# Patient Record
Sex: Female | Born: 1960 | Race: White | Hispanic: No | Marital: Married
Health system: Southern US, Academic
[De-identification: ages and names within clinical notes are randomized; demographics above are authoritative.]

## PROBLEM LIST (undated history)

## (undated) ENCOUNTER — Encounter

## (undated) ENCOUNTER — Telehealth
Attending: Student in an Organized Health Care Education/Training Program | Primary: Student in an Organized Health Care Education/Training Program

## (undated) ENCOUNTER — Encounter
Attending: Student in an Organized Health Care Education/Training Program | Primary: Student in an Organized Health Care Education/Training Program

## (undated) ENCOUNTER — Ambulatory Visit: Payer: PRIVATE HEALTH INSURANCE

## (undated) ENCOUNTER — Telehealth: Attending: Internal Medicine | Primary: Internal Medicine

## (undated) ENCOUNTER — Telehealth

## (undated) ENCOUNTER — Ambulatory Visit

## (undated) ENCOUNTER — Ambulatory Visit
Payer: PRIVATE HEALTH INSURANCE | Attending: Rehabilitative and Restorative Service Providers" | Primary: Rehabilitative and Restorative Service Providers"

## (undated) ENCOUNTER — Encounter: Attending: Diagnostic Radiology | Primary: Diagnostic Radiology

## (undated) ENCOUNTER — Encounter: Attending: Family | Primary: Family

## (undated) ENCOUNTER — Encounter
Attending: Rehabilitative and Restorative Service Providers" | Primary: Rehabilitative and Restorative Service Providers"

## (undated) ENCOUNTER — Ambulatory Visit
Payer: Medicare (Managed Care) | Attending: Student in an Organized Health Care Education/Training Program | Primary: Student in an Organized Health Care Education/Training Program

## (undated) ENCOUNTER — Ambulatory Visit: Attending: Internal Medicine | Primary: Internal Medicine

## (undated) ENCOUNTER — Telehealth: Attending: Family | Primary: Family

## (undated) ENCOUNTER — Encounter: Attending: Hematology & Oncology | Primary: Hematology & Oncology

## (undated) ENCOUNTER — Ambulatory Visit: Attending: Family | Primary: Family

## (undated) ENCOUNTER — Ambulatory Visit: Payer: Medicare (Managed Care)

## (undated) ENCOUNTER — Encounter: Attending: Internal Medicine | Primary: Internal Medicine

## (undated) ENCOUNTER — Ambulatory Visit
Attending: Student in an Organized Health Care Education/Training Program | Primary: Student in an Organized Health Care Education/Training Program

## (undated) ENCOUNTER — Ambulatory Visit: Attending: Family Medicine | Primary: Family Medicine

## (undated) ENCOUNTER — Ambulatory Visit
Payer: PRIVATE HEALTH INSURANCE | Attending: Student in an Organized Health Care Education/Training Program | Primary: Student in an Organized Health Care Education/Training Program

## (undated) ENCOUNTER — Ambulatory Visit: Attending: Pharmacist | Primary: Pharmacist

## (undated) ENCOUNTER — Ambulatory Visit: Payer: PRIVATE HEALTH INSURANCE | Attending: Hematology & Oncology | Primary: Hematology & Oncology

## (undated) ENCOUNTER — Encounter: Attending: Family Medicine | Primary: Family Medicine

## (undated) ENCOUNTER — Ambulatory Visit: Payer: PRIVATE HEALTH INSURANCE | Attending: Internal Medicine | Primary: Internal Medicine

## (undated) ENCOUNTER — Ambulatory Visit: Payer: BLUE CROSS/BLUE SHIELD

## (undated) ENCOUNTER — Encounter: Attending: "Endocrinology | Primary: "Endocrinology

## (undated) DIAGNOSIS — G459 Transient cerebral ischemic attack, unspecified: Secondary | ICD-10-CM

## (undated) DIAGNOSIS — K589 Irritable bowel syndrome without diarrhea: Secondary | ICD-10-CM

## (undated) DIAGNOSIS — K219 Gastro-esophageal reflux disease without esophagitis: Secondary | ICD-10-CM

## (undated) DIAGNOSIS — L4050 Arthropathic psoriasis, unspecified: Secondary | ICD-10-CM

## (undated) DIAGNOSIS — F419 Anxiety disorder, unspecified: Secondary | ICD-10-CM

## (undated) DIAGNOSIS — R079 Chest pain, unspecified: Secondary | ICD-10-CM

## (undated) DIAGNOSIS — E785 Hyperlipidemia, unspecified: Secondary | ICD-10-CM

## (undated) DIAGNOSIS — I5189 Other ill-defined heart diseases: Secondary | ICD-10-CM

## (undated) DIAGNOSIS — M1990 Unspecified osteoarthritis, unspecified site: Secondary | ICD-10-CM

## (undated) DIAGNOSIS — Z8719 Personal history of other diseases of the digestive system: Secondary | ICD-10-CM

## (undated) HISTORY — PX: BACK SURGERY: SHX140

## (undated) HISTORY — PX: ENDARTERECTOMY: SHX5162

## (undated) HISTORY — DX: Chest pain, unspecified: R07.9

## (undated) HISTORY — PX: TUBAL LIGATION: SHX77

## (undated) HISTORY — DX: Hyperlipidemia, unspecified: E78.5

## (undated) HISTORY — DX: Gastro-esophageal reflux disease without esophagitis: K21.9

## (undated) HISTORY — PX: EYE SURGERY: SHX253

## (undated) HISTORY — DX: Other ill-defined heart diseases: I51.89

## (undated) HISTORY — PX: CHOLECYSTECTOMY: SHX55

## (undated) HISTORY — DX: Irritable bowel syndrome, unspecified: K58.9

---

## 1993-12-18 DIAGNOSIS — G459 Transient cerebral ischemic attack, unspecified: Secondary | ICD-10-CM

## 1993-12-18 HISTORY — DX: Transient cerebral ischemic attack, unspecified: G45.9

## 1998-03-22 ENCOUNTER — Encounter: Admission: RE | Admit: 1998-03-22 | Discharge: 1998-03-22 | Payer: Self-pay | Admitting: Sports Medicine

## 1998-04-16 ENCOUNTER — Encounter: Admission: RE | Admit: 1998-04-16 | Discharge: 1998-04-16 | Payer: Self-pay | Admitting: Family Medicine

## 1998-04-19 ENCOUNTER — Ambulatory Visit (HOSPITAL_COMMUNITY): Admission: RE | Admit: 1998-04-19 | Discharge: 1998-04-19 | Payer: Self-pay | Admitting: Family Medicine

## 1998-05-14 ENCOUNTER — Ambulatory Visit: Admission: RE | Admit: 1998-05-14 | Discharge: 1998-05-14 | Payer: Self-pay | Admitting: Family Medicine

## 1998-06-18 ENCOUNTER — Encounter: Admission: RE | Admit: 1998-06-18 | Discharge: 1998-06-18 | Payer: Self-pay | Admitting: Family Medicine

## 1998-09-06 ENCOUNTER — Encounter: Admission: RE | Admit: 1998-09-06 | Discharge: 1998-09-06 | Payer: Self-pay | Admitting: Family Medicine

## 1998-09-13 ENCOUNTER — Other Ambulatory Visit: Admission: RE | Admit: 1998-09-13 | Discharge: 1998-09-13 | Payer: Self-pay | Admitting: Family Medicine

## 1998-09-13 ENCOUNTER — Encounter: Admission: RE | Admit: 1998-09-13 | Discharge: 1998-09-13 | Payer: Self-pay | Admitting: Family Medicine

## 1998-10-25 ENCOUNTER — Encounter: Admission: RE | Admit: 1998-10-25 | Discharge: 1998-10-25 | Payer: Self-pay | Admitting: Family Medicine

## 1998-10-26 ENCOUNTER — Ambulatory Visit (HOSPITAL_COMMUNITY): Admission: RE | Admit: 1998-10-26 | Discharge: 1998-10-26 | Payer: Self-pay | Admitting: Family Medicine

## 1998-10-26 ENCOUNTER — Encounter: Payer: Self-pay | Admitting: Family Medicine

## 1999-01-06 ENCOUNTER — Encounter: Admission: RE | Admit: 1999-01-06 | Discharge: 1999-01-06 | Payer: Self-pay | Admitting: Sports Medicine

## 1999-02-21 ENCOUNTER — Encounter: Admission: RE | Admit: 1999-02-21 | Discharge: 1999-02-21 | Payer: Self-pay | Admitting: Family Medicine

## 1999-04-15 ENCOUNTER — Encounter: Admission: RE | Admit: 1999-04-15 | Discharge: 1999-04-15 | Payer: Self-pay | Admitting: Family Medicine

## 1999-06-03 ENCOUNTER — Encounter: Admission: RE | Admit: 1999-06-03 | Discharge: 1999-06-03 | Payer: Self-pay | Admitting: Family Medicine

## 1999-07-27 ENCOUNTER — Encounter: Admission: RE | Admit: 1999-07-27 | Discharge: 1999-07-27 | Payer: Self-pay | Admitting: Family Medicine

## 1999-08-15 ENCOUNTER — Encounter: Admission: RE | Admit: 1999-08-15 | Discharge: 1999-08-15 | Payer: Self-pay | Admitting: Family Medicine

## 1999-09-21 ENCOUNTER — Encounter: Admission: RE | Admit: 1999-09-21 | Discharge: 1999-09-21 | Payer: Self-pay | Admitting: Family Medicine

## 1999-10-11 ENCOUNTER — Ambulatory Visit (HOSPITAL_COMMUNITY): Admission: RE | Admit: 1999-10-11 | Discharge: 1999-10-11 | Payer: Self-pay | Admitting: Family Medicine

## 1999-10-11 ENCOUNTER — Encounter: Payer: Self-pay | Admitting: Family Medicine

## 2000-02-17 ENCOUNTER — Encounter: Admission: RE | Admit: 2000-02-17 | Discharge: 2000-02-17 | Payer: Self-pay | Admitting: Family Medicine

## 2000-04-27 ENCOUNTER — Encounter: Admission: RE | Admit: 2000-04-27 | Discharge: 2000-04-27 | Payer: Self-pay | Admitting: Family Medicine

## 2000-05-07 ENCOUNTER — Encounter: Payer: Self-pay | Admitting: Emergency Medicine

## 2000-05-07 ENCOUNTER — Emergency Department (HOSPITAL_COMMUNITY): Admission: EM | Admit: 2000-05-07 | Discharge: 2000-05-07 | Payer: Self-pay | Admitting: *Deleted

## 2000-05-14 ENCOUNTER — Encounter: Admission: RE | Admit: 2000-05-14 | Discharge: 2000-05-14 | Payer: Self-pay | Admitting: Family Medicine

## 2000-10-02 ENCOUNTER — Encounter: Payer: Self-pay | Admitting: *Deleted

## 2000-10-02 ENCOUNTER — Emergency Department (HOSPITAL_COMMUNITY): Admission: EM | Admit: 2000-10-02 | Discharge: 2000-10-02 | Payer: Self-pay | Admitting: *Deleted

## 2000-10-04 ENCOUNTER — Encounter: Admission: RE | Admit: 2000-10-04 | Discharge: 2000-10-04 | Payer: Self-pay | Admitting: Family Medicine

## 2000-10-12 ENCOUNTER — Encounter: Payer: Self-pay | Admitting: Family Medicine

## 2000-10-12 ENCOUNTER — Encounter: Admission: RE | Admit: 2000-10-12 | Discharge: 2000-10-12 | Payer: Self-pay | Admitting: Family Medicine

## 2000-10-18 ENCOUNTER — Encounter: Admission: RE | Admit: 2000-10-18 | Discharge: 2000-10-18 | Payer: Self-pay | Admitting: Family Medicine

## 2000-10-31 ENCOUNTER — Encounter: Admission: RE | Admit: 2000-10-31 | Discharge: 2000-10-31 | Payer: Self-pay | Admitting: Family Medicine

## 2000-11-26 ENCOUNTER — Encounter: Admission: RE | Admit: 2000-11-26 | Discharge: 2000-11-26 | Payer: Self-pay | Admitting: Family Medicine

## 2000-11-28 ENCOUNTER — Ambulatory Visit (HOSPITAL_COMMUNITY): Admission: RE | Admit: 2000-11-28 | Discharge: 2000-11-28 | Payer: Self-pay | Admitting: Family Medicine

## 2000-12-05 ENCOUNTER — Ambulatory Visit (HOSPITAL_COMMUNITY): Admission: RE | Admit: 2000-12-05 | Discharge: 2000-12-05 | Payer: Self-pay | Admitting: Family Medicine

## 2000-12-05 ENCOUNTER — Encounter: Payer: Self-pay | Admitting: Family Medicine

## 2000-12-14 ENCOUNTER — Encounter: Admission: RE | Admit: 2000-12-14 | Discharge: 2000-12-14 | Payer: Self-pay | Admitting: Family Medicine

## 2000-12-25 ENCOUNTER — Encounter: Admission: RE | Admit: 2000-12-25 | Discharge: 2000-12-25 | Payer: Self-pay | Admitting: Family Medicine

## 2000-12-31 ENCOUNTER — Encounter: Payer: Self-pay | Admitting: Neurology

## 2000-12-31 ENCOUNTER — Ambulatory Visit (HOSPITAL_COMMUNITY): Admission: AD | Admit: 2000-12-31 | Discharge: 2000-12-31 | Payer: Self-pay | Admitting: Neurology

## 2001-02-01 ENCOUNTER — Encounter: Admission: RE | Admit: 2001-02-01 | Discharge: 2001-02-01 | Payer: Self-pay | Admitting: Family Medicine

## 2001-03-18 ENCOUNTER — Encounter (INDEPENDENT_AMBULATORY_CARE_PROVIDER_SITE_OTHER): Payer: Self-pay | Admitting: *Deleted

## 2001-03-18 LAB — CONVERTED CEMR LAB

## 2001-04-12 ENCOUNTER — Encounter: Admission: RE | Admit: 2001-04-12 | Discharge: 2001-04-12 | Payer: Self-pay | Admitting: Family Medicine

## 2001-04-17 ENCOUNTER — Encounter: Admission: RE | Admit: 2001-04-17 | Discharge: 2001-04-17 | Payer: Self-pay | Admitting: Family Medicine

## 2001-10-15 ENCOUNTER — Encounter: Payer: Self-pay | Admitting: Family Medicine

## 2001-10-15 ENCOUNTER — Encounter: Admission: RE | Admit: 2001-10-15 | Discharge: 2001-10-15 | Payer: Self-pay | Admitting: Family Medicine

## 2001-11-11 ENCOUNTER — Encounter: Admission: RE | Admit: 2001-11-11 | Discharge: 2001-11-11 | Payer: Self-pay | Admitting: Family Medicine

## 2001-11-15 ENCOUNTER — Encounter: Admission: RE | Admit: 2001-11-15 | Discharge: 2001-11-15 | Payer: Self-pay | Admitting: Family Medicine

## 2001-11-15 ENCOUNTER — Encounter: Payer: Self-pay | Admitting: Family Medicine

## 2002-01-24 ENCOUNTER — Other Ambulatory Visit: Admission: RE | Admit: 2002-01-24 | Discharge: 2002-01-24 | Payer: Self-pay | Admitting: *Deleted

## 2002-03-31 ENCOUNTER — Encounter: Admission: RE | Admit: 2002-03-31 | Discharge: 2002-03-31 | Payer: Self-pay | Admitting: Family Medicine

## 2002-11-17 ENCOUNTER — Encounter: Admission: RE | Admit: 2002-11-17 | Discharge: 2002-11-17 | Payer: Self-pay | Admitting: Family Medicine

## 2002-11-17 ENCOUNTER — Encounter: Payer: Self-pay | Admitting: Family Medicine

## 2003-07-27 ENCOUNTER — Emergency Department (HOSPITAL_COMMUNITY): Admission: EM | Admit: 2003-07-27 | Discharge: 2003-07-27 | Payer: Self-pay | Admitting: Emergency Medicine

## 2003-09-21 ENCOUNTER — Other Ambulatory Visit: Admission: RE | Admit: 2003-09-21 | Discharge: 2003-09-21 | Payer: Self-pay | Admitting: Obstetrics and Gynecology

## 2003-12-14 ENCOUNTER — Encounter: Admission: RE | Admit: 2003-12-14 | Discharge: 2003-12-14 | Payer: Self-pay | Admitting: Obstetrics and Gynecology

## 2004-01-01 ENCOUNTER — Encounter: Admission: RE | Admit: 2004-01-01 | Discharge: 2004-01-01 | Payer: Self-pay | Admitting: Family Medicine

## 2004-01-29 ENCOUNTER — Encounter: Admission: RE | Admit: 2004-01-29 | Discharge: 2004-01-29 | Payer: Self-pay | Admitting: Family Medicine

## 2004-02-11 ENCOUNTER — Encounter: Admission: RE | Admit: 2004-02-11 | Discharge: 2004-02-11 | Payer: Self-pay | Admitting: Family Medicine

## 2004-02-15 ENCOUNTER — Encounter: Admission: RE | Admit: 2004-02-15 | Discharge: 2004-02-15 | Payer: Self-pay | Admitting: Family Medicine

## 2004-06-15 ENCOUNTER — Encounter: Payer: Self-pay | Admitting: Gastroenterology

## 2004-12-26 ENCOUNTER — Ambulatory Visit (HOSPITAL_COMMUNITY): Admission: RE | Admit: 2004-12-26 | Discharge: 2004-12-26 | Payer: Self-pay | Admitting: Family Medicine

## 2004-12-26 ENCOUNTER — Ambulatory Visit: Payer: Self-pay | Admitting: Family Medicine

## 2004-12-28 ENCOUNTER — Ambulatory Visit: Payer: Self-pay | Admitting: Gastroenterology

## 2005-01-04 ENCOUNTER — Ambulatory Visit: Payer: Self-pay | Admitting: Gastroenterology

## 2005-01-04 HISTORY — PX: ESOPHAGOGASTRODUODENOSCOPY: SHX1529

## 2005-01-05 ENCOUNTER — Ambulatory Visit: Payer: Self-pay | Admitting: Family Medicine

## 2005-01-27 ENCOUNTER — Ambulatory Visit: Payer: Self-pay | Admitting: Family Medicine

## 2005-02-03 ENCOUNTER — Ambulatory Visit: Payer: Self-pay | Admitting: Gastroenterology

## 2005-02-28 ENCOUNTER — Encounter: Admission: RE | Admit: 2005-02-28 | Discharge: 2005-02-28 | Payer: Self-pay | Admitting: Family Medicine

## 2005-03-16 ENCOUNTER — Encounter: Admission: RE | Admit: 2005-03-16 | Discharge: 2005-03-16 | Payer: Self-pay | Admitting: Family Medicine

## 2005-08-07 ENCOUNTER — Ambulatory Visit: Payer: Self-pay | Admitting: Gastroenterology

## 2005-08-10 ENCOUNTER — Ambulatory Visit (HOSPITAL_COMMUNITY): Admission: RE | Admit: 2005-08-10 | Discharge: 2005-08-10 | Payer: Self-pay | Admitting: Gastroenterology

## 2005-08-15 ENCOUNTER — Encounter: Admission: RE | Admit: 2005-08-15 | Discharge: 2005-11-13 | Payer: Self-pay | Admitting: Gastroenterology

## 2005-08-24 ENCOUNTER — Encounter: Admission: RE | Admit: 2005-08-24 | Discharge: 2005-08-24 | Payer: Self-pay | Admitting: Family Medicine

## 2005-10-11 ENCOUNTER — Ambulatory Visit: Payer: Self-pay | Admitting: Gastroenterology

## 2006-03-01 ENCOUNTER — Encounter: Admission: RE | Admit: 2006-03-01 | Discharge: 2006-03-01 | Payer: Self-pay | Admitting: Family Medicine

## 2006-04-09 ENCOUNTER — Ambulatory Visit: Payer: Self-pay | Admitting: Family Medicine

## 2006-04-16 ENCOUNTER — Ambulatory Visit: Payer: Self-pay | Admitting: Family Medicine

## 2007-02-14 DIAGNOSIS — K589 Irritable bowel syndrome without diarrhea: Secondary | ICD-10-CM | POA: Insufficient documentation

## 2007-02-14 DIAGNOSIS — M545 Low back pain, unspecified: Secondary | ICD-10-CM | POA: Insufficient documentation

## 2007-02-14 DIAGNOSIS — L408 Other psoriasis: Secondary | ICD-10-CM | POA: Insufficient documentation

## 2007-02-14 DIAGNOSIS — K2100 Gastro-esophageal reflux disease with esophagitis, without bleeding: Secondary | ICD-10-CM | POA: Insufficient documentation

## 2007-02-15 ENCOUNTER — Encounter (INDEPENDENT_AMBULATORY_CARE_PROVIDER_SITE_OTHER): Payer: Self-pay | Admitting: *Deleted

## 2007-03-12 ENCOUNTER — Encounter: Admission: RE | Admit: 2007-03-12 | Discharge: 2007-03-12 | Payer: Self-pay | Admitting: Obstetrics and Gynecology

## 2007-08-22 ENCOUNTER — Telehealth: Payer: Self-pay | Admitting: Family Medicine

## 2007-09-04 ENCOUNTER — Ambulatory Visit: Payer: Self-pay | Admitting: Cardiology

## 2008-01-27 ENCOUNTER — Ambulatory Visit: Payer: Self-pay | Admitting: Gastroenterology

## 2008-02-11 ENCOUNTER — Telehealth: Payer: Self-pay | Admitting: Family Medicine

## 2008-03-13 ENCOUNTER — Encounter: Admission: RE | Admit: 2008-03-13 | Discharge: 2008-03-13 | Payer: Self-pay | Admitting: Obstetrics and Gynecology

## 2008-03-17 ENCOUNTER — Emergency Department (HOSPITAL_COMMUNITY): Admission: EM | Admit: 2008-03-17 | Discharge: 2008-03-17 | Payer: Self-pay | Admitting: Emergency Medicine

## 2008-04-17 ENCOUNTER — Emergency Department (HOSPITAL_COMMUNITY): Admission: EM | Admit: 2008-04-17 | Discharge: 2008-04-17 | Payer: Self-pay | Admitting: Emergency Medicine

## 2008-04-18 ENCOUNTER — Emergency Department (HOSPITAL_COMMUNITY): Admission: EM | Admit: 2008-04-18 | Discharge: 2008-04-18 | Payer: Self-pay | Admitting: Emergency Medicine

## 2008-04-20 ENCOUNTER — Ambulatory Visit: Payer: Self-pay | Admitting: Family Medicine

## 2008-04-27 ENCOUNTER — Telehealth: Payer: Self-pay | Admitting: Family Medicine

## 2008-05-15 ENCOUNTER — Ambulatory Visit: Payer: Self-pay | Admitting: Family Medicine

## 2008-05-19 ENCOUNTER — Encounter: Payer: Self-pay | Admitting: Family Medicine

## 2008-05-19 ENCOUNTER — Ambulatory Visit: Payer: Self-pay | Admitting: Family Medicine

## 2008-05-20 LAB — CONVERTED CEMR LAB
HDL: 49 mg/dL (ref >39)
Total CHOL/HDL Ratio: 4.8
Triglycerides: 292 mg/dL — ABNORMAL HIGH (ref <150)
VLDL: 58 mg/dL — ABNORMAL HIGH (ref 0–40)

## 2008-05-22 ENCOUNTER — Encounter: Payer: Self-pay | Admitting: *Deleted

## 2008-10-19 ENCOUNTER — Encounter: Payer: Self-pay | Admitting: Family Medicine

## 2008-10-19 LAB — CONVERTED CEMR LAB: HDL: 50 mg/dL

## 2009-03-04 ENCOUNTER — Telehealth: Payer: Self-pay | Admitting: *Deleted

## 2009-03-10 ENCOUNTER — Ambulatory Visit: Payer: Self-pay | Admitting: Family Medicine

## 2009-03-12 ENCOUNTER — Ambulatory Visit: Payer: Self-pay | Admitting: Family Medicine

## 2009-03-12 ENCOUNTER — Encounter: Payer: Self-pay | Admitting: Family Medicine

## 2009-03-12 LAB — CONVERTED CEMR LAB
ALT: 21 units/L (ref 0–35)
AST: 18 units/L (ref 0–37)
Albumin: 4.5 g/dL (ref 3.5–5.2)
Alkaline Phosphatase: 104 units/L (ref 39–117)
BUN: 13 mg/dL (ref 6–23)
Calcium: 10.2 mg/dL (ref 8.4–10.5)
Chloride: 105 meq/L (ref 96–112)
Cholesterol: 235 mg/dL — ABNORMAL HIGH (ref 0–200)
Creatinine, Ser: 0.82 mg/dL (ref 0.40–1.20)
Glucose, Bld: 96 mg/dL (ref 70–99)
HDL: 42 mg/dL (ref >39)
Potassium: 4.7 meq/L (ref 3.5–5.3)
RDW: 12.4 % (ref 11.5–15.5)
TSH: 1.405 microintl units/mL (ref 0.350–4.500)
Total Bilirubin: 0.5 mg/dL (ref 0.3–1.2)
Total Protein: 7.5 g/dL (ref 6.0–8.3)

## 2009-03-17 ENCOUNTER — Encounter: Admission: RE | Admit: 2009-03-17 | Discharge: 2009-03-17 | Payer: Self-pay | Admitting: Obstetrics and Gynecology

## 2009-03-17 ENCOUNTER — Encounter: Payer: Self-pay | Admitting: Family Medicine

## 2009-03-24 ENCOUNTER — Encounter: Payer: Self-pay | Admitting: Family Medicine

## 2009-03-27 DIAGNOSIS — E785 Hyperlipidemia, unspecified: Secondary | ICD-10-CM | POA: Insufficient documentation

## 2009-04-05 ENCOUNTER — Ambulatory Visit: Payer: Self-pay | Admitting: Family Medicine

## 2009-04-27 ENCOUNTER — Ambulatory Visit: Payer: Self-pay | Admitting: Family Medicine

## 2009-04-29 ENCOUNTER — Encounter: Payer: Self-pay | Admitting: Family Medicine

## 2009-06-04 ENCOUNTER — Telehealth (INDEPENDENT_AMBULATORY_CARE_PROVIDER_SITE_OTHER): Payer: Self-pay | Admitting: *Deleted

## 2009-06-10 ENCOUNTER — Ambulatory Visit: Payer: Self-pay | Admitting: Family Medicine

## 2009-06-10 ENCOUNTER — Encounter: Payer: Self-pay | Admitting: Family Medicine

## 2009-06-10 LAB — CONVERTED CEMR LAB
ALT: 24 units/L (ref 0–35)
AST: 28 units/L (ref 0–37)
BUN: 22 mg/dL (ref 6–23)
Calcium: 9.8 mg/dL (ref 8.4–10.5)
Creatinine, Ser: 0.9 mg/dL (ref 0.40–1.20)
Glucose, Bld: 92 mg/dL (ref 70–99)
Total Bilirubin: 0.4 mg/dL (ref 0.3–1.2)
Total CHOL/HDL Ratio: 4.8
Total Protein: 7.2 g/dL (ref 6.0–8.3)

## 2009-07-09 ENCOUNTER — Ambulatory Visit: Payer: Self-pay | Admitting: Family Medicine

## 2009-08-20 ENCOUNTER — Ambulatory Visit: Payer: Self-pay | Admitting: Family Medicine

## 2009-08-20 LAB — CONVERTED CEMR LAB
ALT: 23 units/L (ref 0–35)
Albumin: 4.4 g/dL (ref 3.5–5.2)
Alkaline Phosphatase: 69 units/L (ref 39–117)
Calcium: 10.1 mg/dL (ref 8.4–10.5)
Chloride: 108 meq/L (ref 96–112)
Creatinine, Ser: 0.89 mg/dL (ref 0.40–1.20)
Glucose, Bld: 93 mg/dL (ref 70–99)
Lipase: 16 units/L (ref 0–75)
Potassium: 4.5 meq/L (ref 3.5–5.3)

## 2009-09-10 ENCOUNTER — Ambulatory Visit: Payer: Self-pay | Admitting: Family Medicine

## 2010-01-06 ENCOUNTER — Telehealth: Payer: Self-pay | Admitting: Gastroenterology

## 2010-03-18 ENCOUNTER — Encounter: Admission: RE | Admit: 2010-03-18 | Discharge: 2010-03-18 | Payer: Self-pay | Admitting: Family Medicine

## 2010-03-18 LAB — CONVERTED CEMR LAB

## 2010-05-20 ENCOUNTER — Ambulatory Visit: Payer: Self-pay | Admitting: Family Medicine

## 2010-06-10 ENCOUNTER — Ambulatory Visit: Payer: Self-pay | Admitting: Family Medicine

## 2010-06-13 LAB — CONVERTED CEMR LAB
ALT: 17 units/L (ref 0–35)
Albumin: 4.4 g/dL (ref 3.5–5.2)
Alkaline Phosphatase: 62 units/L (ref 39–117)
Calcium: 10.3 mg/dL (ref 8.4–10.5)
Cholesterol: 216 mg/dL — ABNORMAL HIGH (ref 0–200)
Glucose, Bld: 89 mg/dL (ref 70–99)
RBC: 4.75 M/uL (ref 3.87–5.11)
Total Bilirubin: 0.3 mg/dL (ref 0.3–1.2)
Total Protein: 7.1 g/dL (ref 6.0–8.3)
WBC: 7.2 10*3/uL (ref 4.0–10.5)

## 2010-07-26 ENCOUNTER — Encounter: Payer: Self-pay | Admitting: Family Medicine

## 2010-08-04 ENCOUNTER — Telehealth: Payer: Self-pay | Admitting: *Deleted

## 2010-09-02 ENCOUNTER — Telehealth: Payer: Self-pay | Admitting: Gastroenterology

## 2010-09-05 ENCOUNTER — Ambulatory Visit: Payer: Self-pay | Admitting: Gastroenterology

## 2010-09-05 ENCOUNTER — Encounter: Payer: Self-pay | Admitting: Gastroenterology

## 2010-09-21 ENCOUNTER — Telehealth: Payer: Self-pay | Admitting: Gastroenterology

## 2010-10-05 ENCOUNTER — Ambulatory Visit: Payer: Self-pay | Admitting: Gastroenterology

## 2010-10-05 HISTORY — PX: COLONOSCOPY: SHX174

## 2010-10-13 LAB — CONVERTED CEMR LAB
Alkaline Phosphatase: 66 units/L
CO2: 28 meq/L
Calcium: 10.4 mg/dL
Chloride: 106 meq/L
Glucose, Bld: 95 mg/dL
HCT: 44.5 %
HDL: 42 mg/dL
Hemoglobin: 14.4 g/dL
LDL Cholesterol: 142 mg/dL
Potassium: 4.8 meq/L
Sodium: 142 meq/L
Total Bilirubin: 0.4 mg/dL

## 2010-10-21 ENCOUNTER — Ambulatory Visit (HOSPITAL_COMMUNITY): Admission: RE | Admit: 2010-10-21 | Discharge: 2010-10-21 | Payer: Self-pay | Admitting: Family Medicine

## 2010-10-21 ENCOUNTER — Ambulatory Visit: Payer: Self-pay | Admitting: Family Medicine

## 2010-10-21 DIAGNOSIS — L301 Dyshidrosis [pompholyx]: Secondary | ICD-10-CM | POA: Insufficient documentation

## 2010-10-21 DIAGNOSIS — M5030 Other cervical disc degeneration, unspecified cervical region: Secondary | ICD-10-CM | POA: Insufficient documentation

## 2010-10-21 DIAGNOSIS — M67919 Unspecified disorder of synovium and tendon, unspecified shoulder: Secondary | ICD-10-CM | POA: Insufficient documentation

## 2010-10-21 DIAGNOSIS — M719 Bursopathy, unspecified: Secondary | ICD-10-CM

## 2010-12-13 ENCOUNTER — Emergency Department (HOSPITAL_COMMUNITY)
Admission: EM | Admit: 2010-12-13 | Discharge: 2010-12-13 | Payer: Self-pay | Source: Home / Self Care | Admitting: Emergency Medicine

## 2010-12-15 ENCOUNTER — Telehealth: Payer: Self-pay | Admitting: Family Medicine

## 2010-12-21 ENCOUNTER — Ambulatory Visit (HOSPITAL_COMMUNITY)
Admission: RE | Admit: 2010-12-21 | Discharge: 2010-12-21 | Payer: Self-pay | Source: Home / Self Care | Attending: Family Medicine | Admitting: Family Medicine

## 2010-12-21 ENCOUNTER — Other Ambulatory Visit: Payer: Self-pay | Admitting: Family Medicine

## 2010-12-23 ENCOUNTER — Ambulatory Visit
Admission: RE | Admit: 2010-12-23 | Discharge: 2010-12-23 | Payer: Self-pay | Source: Home / Self Care | Attending: Family Medicine | Admitting: Family Medicine

## 2010-12-23 DIAGNOSIS — G63 Polyneuropathy in diseases classified elsewhere: Secondary | ICD-10-CM | POA: Insufficient documentation

## 2011-01-08 ENCOUNTER — Encounter: Payer: Self-pay | Admitting: Obstetrics and Gynecology

## 2011-01-17 NOTE — Progress Notes (Signed)
 ----   Converted from flag ---- ---- 08/04/2010 4:05 PM, Tawanna Cooler McDiarmid MD wrote: Famotidine may be taken in divided doses or just once a day.  ---- 08/04/2010 3:43 PM, Theresia Lo RN wrote: I sent  Rx in for famotidine then realized you had stated twice daily . I sent it in 2 tabs daily . is this ok or should I tell patient to take  one tab twice daily.  Larita Fife ------------------------------

## 2011-01-17 NOTE — Progress Notes (Signed)
Summary: move procedure?   Phone Note Call from Patient Call back at [facility] Phone [phone]   Caller: Patient Call For: Dr. Russella Dar Reason for Call: Talk to Nurse Summary of Call: pt reporting that now her BMs are nothing but blood... would like to move COL to a sooner date if possible Initial call taken by: Vallarie Mare,  September 21, 2010 8:34 AM  Follow-up for Phone Call        Left message for patient to call back Darcey Nora RN, [facility]  September 21, 2010 9:01 AM  patient returned call.  She c/o large hard painful BM, followed by large amount of bright red blood.  First time she has had bleeding in several days.  Patient  has been taking Miralax , daily but still has hard stools.  Patient is advised to start on colace once daily.  She has requested her colon be moved up, she is rescheduled for 10/04/10 at 11:30.  Patient  advised to start back on anusol supp as previously ordered.  She will call back for further problems. Follow-up by: Darcey Nora RN, CGRN,  September 21, 2010 9:20 AM  Additional Follow-up for Phone Call Additional follow up Details #1::        Agree with above  plans. Additional Follow-up by: Meryl Dare MD Clementeen Graham,  September 21, 2010 10:40 AM

## 2011-01-17 NOTE — Assessment & Plan Note (Signed)
Summary: cpe,tcb   Vital Signs:  Patient profile:   50 year old female Height:      66 inches Weight:      178.7 pounds BMI:     28.95 Temp:     97.7 degrees F oral Pulse rate:   69 / minute Pulse rhythm:   regular BP sitting:   110 / 74  (left arm) Cuff size:   regular  Vitals Entered By: Loralee Pacas CMA (May 20, 2010 2:20 PM) CC: Fu problems   CC:  Fu problems.  History of Present Illness: Due for triglyceride check and labs  No complaints. Complains of occasional blood in stool with constipation.  Known hemorrhoids.  Last colonoscopy no polyps 2005 Sees GYn for paps   Habits & Providers  Alcohol-Tobacco-Diet     Alcohol drinks/day: 0     Tobacco Status: never     Diet Comments: generally healthy  Exercise-Depression-Behavior     Does Patient Exercise: yes     Exercise Counseling: not indicated; exercise is adequate     Type of exercise: walk     Exercise (avg: min/session): 30-60     Times/week: 5     Have you felt down or hopeless? no     Have you felt little pleasure in things? no     STD Risk: never     Drug Use: never     Seat Belt Use: always     Sun Exposure: frequent  Current Medications (verified): 1)  Diclofenac Sodium 75 Mg Tbec (Diclofenac Sodium) .... Take 1 Tablet By Mouth Twice A Day 2)  Famotidine 20 Mg Tabs (Famotidine) .... One By Mouth Daily 3)  Fish Oil Concentrate 1000 Mg  Caps (Omega-3 Fatty Acids) .... One By Mouth Three Times A Day 4)  Miralax   Powd (Polyethylene Glycol 3350) .Marland Kitchen.. 17g By Mouth Once Daily Disp Qs One Month Supply 5)  Lofibra 134 Mg Caps (Fenofibrate Micronized) .... One By Mouth Daily With Meals 6)  Calcium-Vitamin D 500-200 Mg-Unit Tabs (Calcium Carbonate-Vitamin D) .... One Daily Otc  Allergies (verified): 1)  Demerol (Meperidine Hcl) 2)  Penicillin  Past History:  Past medical, surgical, family and social histories (including risk factors) reviewed, and no changes noted (except as noted below).  Past  Medical History: Reviewed history from 03/27/2009 and no changes required. gatroparesis 536.3 HYPERLIPIDEMIA-MIXED (ICD-272.4) SHINGLES (ICD-053.9) VERTIGO NOS OR DIZZINESS (ICD-780.4) REFLUX ESOPHAGITIS (ICD-530.11) PSORIASIS (ICD-696.1) IRRITABLE BOWEL SYNDROME (ICD-564.1) HYPERTRIGLYCERIDEMIA (ICD-272.1) BACK PAIN, LOW (ICD-724.2)    Past Surgical History: Reviewed history from 03/27/2009 and no changes required. BTL -, BTL - 04/12/2001, nl echocardiogram - 12/14/2000, nl MRI of brain - 12/14/2000  Tubal ligation in 1990.  Cholecystectomy in 1991& 1996 Back surgery on L4-5 in 1997.  Catheterization 10 years ago, she thinks.  Family History: Reviewed history from 02/14/2007 and no changes required. DM, Ca, CVA  Social History: Reviewed history from 03/27/2009 and no changes required. Tobacco Use - No.  Alcohol Use - no Regular Exercise - yes  -- Walks 1 mile 4x/week Full Time Divorced  STD Risk:  never Drug Use:  never Risk analyst Use:  always Sun Exposure-Excessive:  frequent  Review of Systems  The patient denies anorexia, fever, chest pain, syncope, dyspnea on exertion, peripheral edema, and unusual weight change.         Menopausal now for 2 years.  Physical Exam  General:  Well-developed,well-nourished,in no acute distress; alert,appropriate and cooperative throughout examination Lungs:  Normal  respiratory effort, chest expands symmetrically. Lungs are clear to auscultation, no crackles or wheezes. Heart:  Normal rate and regular rhythm. S1 and S2 normal without gallop, murmur, click, rub or other extra sounds. Extremities:  No clubbing, cyanosis, edema, or deformity noted with normal full range of motion of all joints.     Impression & Recommendations:  Problem # 1:  HYPERLIPIDEMIA-MIXED (ICD-272.4) Assessment Unchanged  Her updated medication list for this problem includes:    Lofibra 134 Mg Caps (Fenofibrate micronized) ..... One by mouth daily with  meals  Orders: [facility]- Est Level  3 (99213)Future Orders: Lipid-[facility] ([id number]) ... 05/19/2011 Comp Met-[facility] ([id number]) ... 05/19/2011  Problem # 2:  BLOOD IN STOOL (ICD-578.1) Will check CBC but no GI work up since colonoscopy 5 years ago. Orders: [facility]- Est Level  3 (99213)Future Orders: CBC-[facility] ([id number]) ... 05/19/2011  Complete Medication List: 1)  Diclofenac Sodium 75 Mg Tbec (Diclofenac sodium) .... Take 1 tablet by mouth twice a day 2)  Famotidine 20 Mg Tabs (Famotidine) .... One by mouth daily 3)  Fish Oil Concentrate 1000 Mg Caps (Omega-3 fatty acids) .... One by mouth three times a day 4)  Miralax Powd (Polyethylene glycol 3350) .Marland Kitchen.. 17g by mouth once daily disp qs one month supply 5)  Lofibra 134 Mg Caps (Fenofibrate micronized) .... One by mouth daily with meals 6)  Calcium-vitamin D 500-200 Mg-unit Tabs (Calcium carbonate-vitamin d) .... One daily otc  Patient Instructions: 1)  Get blood work done after 06/10/10   Prevention & Chronic Care Immunizations   Influenza vaccine: given  (10/16/2007)   Influenza vaccine due: 10/15/2008    Tetanus booster: 03/18/2006: Done.   Tetanus booster due: 03/18/2016    Pneumococcal vaccine: Not documented  Colorectal Screening   Hemoccult: Done.  (09/18/1999)   Hemoccult action/deferral: Not indicated  (08/20/2009)   Hemoccult due: 09/17/2000    Colonoscopy: Results: Normal.   (02/16/2004)   Colonoscopy due: 02/15/2014  Other Screening   Pap smear: Specimen Adequacy: Satisfactory for evaluation.   Interpretation/Result:Negative for intraepithelial Lesion or Malignancy.     (03/18/2010)   Pap smear due: 03/18/2013    Mammogram: ASSESSMENT: Negative - BI-RADS 1^MM DIGITAL SCREENING  (03/18/2010)   Mammogram due: 03/19/2011   Smoking status: never  (05/20/2010)  Lipids   Total Cholesterol: 219  (06/10/2009)   LDL: 131  (06/10/2009)   LDL Direct: Not documented   HDL: 46  (06/10/2009)   Triglycerides: 211   (06/10/2009)    SGOT (AST): 25  (08/20/2009)   SGPT (ALT): 23  (08/20/2009) CMP ordered    Alkaline phosphatase: 69  (08/20/2009)   Total bilirubin: 0.4  (08/20/2009)    Lipid flowsheet reviewed?: Yes   Progress toward LDL goal: Unchanged  Self-Management Support :   Personal Goals (by the next clinic visit) :      Personal LDL goal: 130  (08/20/2009)    Lipid self-management support: Written self-care plan  (05/20/2010)   Lipid self-care plan printed.    Pap Smear  Procedure date:  03/18/2010  Findings:      Specimen Adequacy: Satisfactory for evaluation.   Interpretation/Result:Negative for intraepithelial Lesion or Malignancy.      Pap Smear  Procedure date:  03/18/2010  Findings:      Specimen Adequacy: Satisfactory for evaluation.   Interpretation/Result:Negative for intraepithelial Lesion or Malignancy.

## 2011-01-17 NOTE — Procedures (Signed)
Summary: EGD   EGD  Procedure date:  01/04/2005  Findings:      Location: [facility]   Patient Name: Michele Carter MRN:  Procedure Procedures: Panendoscopy (EGD) CPT: 43235.    with biopsy(s)/brushing(s). CPT: D1846139.  Personnel: Endoscopist: Venita Lick. Russella Dar, MD, Clementeen Graham.  Exam Location: Exam performed in Outpatient Clinic. Outpatient  Patient Consent: Procedure, Alternatives, Risks and Benefits discussed, consent obtained, from patient. Consent was obtained by the RN.  Indications Symptoms: Abdominal pain, location: epigastric. Reflux symptoms  History  Current Medications: Patient is not currently taking Coumadin.  Pre-Exam Physical: Performed Jan 04, 2005  Entire physical exam was normal.  Exam Exam Info: Maximum depth of insertion Duodenum, intended Duodenum. Vocal cords not visualized. Gastric retroflexion performed. ASA Classification: II. Tolerance: excellent.  Sedation Meds: Patient assessed and found to be appropriate for moderate (conscious) sedation. Fentanyl 50 mcg. given IV. Versed 7 mg. given IV. Cetacaine Spray 2 sprays given aerosolized.  Monitoring: BP and pulse monitoring done. Oximetry used. Supplemental O2 given  Findings Normal: Proximal Esophagus to Cardia.  NODULE: Maximum size: 3 mm. submucosal nodule in Fundus. Biopsy/Nodule taken. 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 Comments: 2 gastric nodules-probable fundic gland polyps.  Normal: Body to Duodenal 2nd Portion.   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   Events  Unplanned Intervention: No unplanned interventions were required.  Unplanned Events: There were no complications. Plans Medication(s): Await pathology. Continue current medications. PPI: Esomeprazole/Nexium 40 mg BID,   Patient Education: Patient given standard instructions for: Reflux.  Disposition: After procedure patient sent to recovery. After recovery patient sent home.    Scheduling: Office Visit, to [employer]. Russella Dar, MD, [facility], around Feb 04, 2005.    This report was created from the original endoscopy report, which was reviewed and signed by the above listed endoscopist.

## 2011-01-17 NOTE — Procedures (Signed)
Summary: COLON   Colonoscopy  Procedure date:  06/15/2004  Findings:      Location:  [facility].   Patient Name: Michele Carter MRN:  Procedure Procedures: Colonoscopy CPT: 8018010539.  Personnel: Endoscopist: Venita Lick. Russella Dar, MD, Clementeen Graham.  Exam Location: Exam performed in Outpatient Clinic. Outpatient  Patient Consent: Procedure, Alternatives, Risks and Benefits discussed, consent obtained, from patient. Consent was obtained by the RN.  Indications Symptoms: Constipation  History  Current Medications: Patient is not currently taking Coumadin.  Pre-Exam Physical: Performed Jun 15, 2004. Entire physical exam was normal.  Exam Exam: Extent of exam reached: Cecum, extent intended: Cecum.  The cecum was identified by appendiceal orifice and IC valve. Colon retroflexion performed. ASA Classification: II. Tolerance: excellent.  Monitoring: Pulse and BP monitoring, Oximetry used. Supplemental O2 given.  Colon Prep Used Miralax for colon prep. Prep results: good.  Sedation Meds: Patient assessed and found to be appropriate for moderate (conscious) sedation. Fentanyl 100 mcg. given IV. Versed 8 mg. given IV.  Findings NORMAL EXAM: Cecum to Rectum.   Assessment Normal examination.  Events  Unplanned Interventions: No intervention was required.  Unplanned Events: There were no complications. Plans Medication Plan: Continue current medications.  Patient Education: Patient given standard instructions for: Constipation.IBS.  Disposition: After procedure patient sent to recovery. After recovery patient sent home.  Scheduling/Referral: Colonoscopy, to [facility] T. Russella Dar, MD, [facility], around Jun 16, 2011.  Office Visit, to [employer]. Russella Dar, MD, [facility], around Jun 15, 2005.    This report was created from the original endoscopy report, which was reviewed and signed by the above listed endoscopist.

## 2011-01-17 NOTE — Progress Notes (Signed)
Summary: Constipation   Phone Note Call from Patient Call back at Work Phone [phone]   Call For: Dr Russella Dar Reason for Call: Talk to Nurse Summary of Call: Is struggling with severe constipation due to IBS. Wonders if something can be called in for her. Initial call taken by: Leanor Kail [facility],  January 06, 2010 9:52 AM  Follow-up for Phone Call        patient c/o constipations.  She has used Miralax sporadically.  I have advised her to increase her Miralax to two times a day.  She will call back if she has further problems. Follow-up by: Darcey Nora RN, CGRN,  January 06, 2010 10:21 AM

## 2011-01-17 NOTE — Progress Notes (Signed)
Summary: soner appt   Phone Note Call from Patient Call back at Home Phone [phone]   Caller: Patient Call For: Dr Russella Dar Reason for Call: Talk to Nurse Summary of Call: Patient wants to be seen sooner than first available appt 11-16 for rectal bleeding x 3 days. Initial call taken by: Tawni Levy,  September 02, 2010 3:01 PM  Follow-up for Phone Call        Patient  c/o 3 days of rectal bleeding, pain.  Blood is on the tissue and in the stool.  She reports her stools are soft.  She will come in and see Amy Esterwood PA 09/05/10 2:00. Follow-up by: Darcey Nora RN, CGRN,  September 02, 2010 3:30 PM

## 2011-01-17 NOTE — Miscellaneous (Signed)
Summary: diclofenac refill  Medications Added DICLOFENAC SODIUM 75 MG TBEC (DICLOFENAC SODIUM) Take 1 tablet by mouth twice a day       Clinical Lists Changes  Medications: Changed medication from DICLOFENAC SODIUM 75 MG TBEC (DICLOFENAC SODIUM) Take 1 tablet by mouth twice a day to DICLOFENAC SODIUM 75 MG TBEC (DICLOFENAC SODIUM) Take 1 tablet by mouth twice a day - Signed Rx of DICLOFENAC SODIUM 75 MG TBEC (DICLOFENAC SODIUM) Take 1 tablet by mouth twice a day;  #180 x 3;  Signed;  Entered by: Doralee Albino MD;  Authorized by: Doralee Albino MD;  Method used: Electronically to [facility]*, [address], [address] Shipping/mailing, [place], [place]  [address], Ph: [phone], Fax: [phone]    Prescriptions: DICLOFENAC SODIUM 75 MG TBEC (DICLOFENAC SODIUM) Take 1 tablet by mouth twice a day  #180 x 3   Entered and Authorized by:   Doralee Albino MD   Signed by:   Doralee Albino MD on 07/26/2010   Method used:   Electronically to        [facility]* (retail)       [address]       [address] [place] [address]mailing       [place], [place]  [address]       Ph: [phone]       Fax: [phone]   RxID:   [id number]

## 2011-01-17 NOTE — Progress Notes (Signed)
 ----   Converted from flag ---- ---- 08/04/2010 2:54 PM, Tawanna Cooler McDiarmid MD wrote: Patient listed with reflux esophagitis, so twice a day famotidine is appropriate. Todd  ---- 08/04/2010 2:14 PM, Theresia Lo RN wrote: med list states to take famotidine 20 mg one tab daily . Electronic refill request is to take 2 daily . please advise.  Larita Fife ------------------------------ rx sent electronically. Theresia Lo RN  August 04, 2010 3:34 PM

## 2011-01-17 NOTE — Procedures (Signed)
Summary: Colonoscopy  Patient: Michele Carter Note: All result statuses are Final unless otherwise noted.  Tests: (1) Colonoscopy (COL)   COL Colonoscopy           DONE     [facility]     [address]     [place], [place]  [address]           COLONOSCOPY PROCEDURE REPORT     PATIENT:  [name], [name]  MR#:  [id number]     BIRTHDATE:  1961/07/09, 49 yrs. old  GENDER:  female     ENDOSCOPIST:  Judie Petit T. Russella Dar, MD, [facility]           PROCEDURE DATE:  10/05/2010     PROCEDURE:  Colon w/ inj sclerosis of hemorrhoids     ASA CLASS:  Class II     INDICATIONS:  1) heme positive stool  2) hematochezia     MEDICATIONS:   Fentanyl 50 mcg IV, Versed 7 mg IV     DESCRIPTION OF PROCEDURE:   After the risks benefits and     alternatives of the procedure were thoroughly explained, informed     consent was obtained.  Digital rectal exam was performed and     revealed moderate external hemorrhoids.   The LB [id number]     [id number] endoscope was introduced through the anus and advanced to     the cecum, which was identified by both the appendix and ileocecal     valve, without limitations.  The quality of the prep was     excellent, using MoviPrep.  The instrument was then slowly     withdrawn as the colon was fully examined.     <<PROCEDUREIMAGES>>     FINDINGS:  A normal appearing cecum, ileocecal valve, and     appendiceal orifice were identified. The ascending, hepatic     flexure, transverse, splenic flexure, descending, sigmoid colon,     and rectum appeared unremarkable.  Internal hemorrhoids were     found. They were moderately sized. Injection sclerosis of internal     hemorrhoids with 3 cc of 23.4% saline injected above the dentate     line. Retroflexed views in the rectum revealed no other findings     other than those already described.  The time to cecum =  2.5     minutes. The scope was then withdrawn (time =  11  min) from the     patient and the procedure completed.           COMPLICATIONS:  None           ENDOSCOPIC IMPRESSION:     1) Normal colon     2) Internal and external hemorrhoids           RECOMMENDATIONS:     1) High fiber diet with liberal fluid intake. Hemorrhoidal     creams and suppositories daily as needed.     2) Continue current colorectal screening for "routine risk"     patients with a repeat colonoscopy in 10 years.           Venita Lick. Russella Dar, MD, Clementeen Graham           n.     eSIGNED:   Venita Lick. Stark at 10/05/2010 10:22 AM           Serita Grit, [id number]  Note: An exclamation mark (!) indicates a result that was not dispersed into the flowsheet. Document Creation Date:  10/05/2010 10:23 AM _______________________________________________________________________  (1) Order result status: Final Collection or observation date-time: 10/05/2010 10:17 Requested date-time:  Receipt date-time:  Reported date-time:  Referring Physician:   Ordering Physician: Claudette Head [phone]) Specimen Source:  Source: Launa Grill Order Number: [id number] Lab site:   Appended Document: Colonoscopy    Clinical Lists Changes  Observations: Added new observation of COLONNXTDUE: 09/2020 (10/05/2010 13:14)

## 2011-01-17 NOTE — Letter (Signed)
Summary: [facility] Instructions  [facility]  [address] [place], [place] [address]   Phone: [phone]  Fax: [phone]       Michele Carter    05/24/1961    MRN: [id number]        Procedure Day /Date: 11-15-111     Arrival Time: 7:30 Am     Procedure Time: 8:30 AM     Location of Procedure:                    X    [facility] (4th Floor) PREPARATION FOR COLONOSCOPY WITH MOVIPREP   Starting 5 days prior to your procedure 10-27-10 do not eat nuts, seeds, popcorn, corn, beans, peas,  salads, or any raw vegetables.  Do not take any fiber supplements (e.g. Metamucil, Citrucel, and Benefiber).  THE DAY BEFORE YOUR PROCEDURE         DATE: 10-31-10  DAY: Tuesday  1.  Drink clear liquids the entire day-NO SOLID FOOD  2.  Do not drink anything colored red or purple.  Avoid juices with pulp.  No orange juice.  3.  Drink at least 64 oz. (8 glasses) of fluid/clear liquids during the day to prevent dehydration and help the prep work efficiently.  CLEAR LIQUIDS INCLUDE: Water Jello Ice Popsicles Tea (sugar ok, no milk/cream) Powdered fruit flavored drinks Coffee (sugar ok, no milk/cream) Gatorade Juice: apple, white grape, white cranberry  Lemonade Clear bullion, consomm, broth Carbonated beverages (any kind) Strained chicken noodle soup Hard Candy                             4.  In the morning, mix first dose of MoviPrep solution:    Empty 1 Pouch A and 1 Pouch B into the disposable container    Add lukewarm drinking water to the top line of the container. Mix to dissolve    Refrigerate (mixed solution should be used within 24 hrs)  5.  Begin drinking the prep at 5:00 p.m. The MoviPrep container is divided by 4 marks.   Every 15 minutes drink the solution down to the next mark (approximately 8 oz) until the full liter is complete.   6.  Follow completed prep with 16 oz of clear liquid of your choice (Nothing red or purple).  Continue to  drink clear liquids until bedtime.  7.  Before going to bed, mix second dose of MoviPrep solution:    Empty 1 Pouch A and 1 Pouch B into the disposable container    Add lukewarm drinking water to the top line of the container. Mix to dissolve    Refrigerate  THE DAY OF YOUR PROCEDURE      DATE: 11-01-10 DAY: Tuesday  Beginning at 3:30 AM  (5 hours before procedure):         1. Every 15 minutes, drink the solution down to the next mark (approx 8 oz) until the full liter is complete.  2. Follow completed prep with 16 oz. of clear liquid of your choice.    3. You may drink clear liquids until 6:30 AM (2 HOURS BEFORE PROCEDURE).   MEDICATION INSTRUCTIONS  Unless otherwise instructed, you should take regular prescription medications with a small sip of water   as early as possible the morning of your procedure.        OTHER INSTRUCTIONS  You will need a responsible adult at least 52 years  of age to accompany you and drive you home.   This person must remain in the waiting room during your procedure.  Wear loose fitting clothing that is easily removed.  Leave jewelry and other valuables at home.  However, you may wish to bring a book to read or  an iPod/MP3 player to listen to music as you wait for your procedure to start.  Remove all body piercing jewelry and leave at home.  Total time from sign-in until discharge is approximately 2-3 hours.  You should go home directly after your procedure and rest.  You can resume normal activities the  day after your procedure.  The day of your procedure you should not:   Drive   Make legal decisions   Operate machinery   Drink alcohol   Return to work  You will receive specific instructions about eating, activities and medications before you leave.    The above instructions have been reviewed and explained to me by   _______________________    I fully understand and can verbalize these instructions  _____________________________ Date _________

## 2011-01-17 NOTE — Assessment & Plan Note (Signed)
Summary: pains in side of neck,tcb   Vital Signs:  Patient profile:   50 year old female Weight:      175.2 pounds Temp:     98.2 degrees F oral Pulse rate:   65 / minute Pulse rhythm:   regular BP sitting:   100 / 67  (right arm) Cuff size:   regular CC: pain in back left side of neck   CC:  pain in back left side of neck.  History of Present Illness: Hand rash.  Painful Cholesterol Had health sreening and brings in results,  which I entered into flow sheet. Has hx of neck problems.  Now with Lt shoulder and arm pain.  Habits & Providers  Alcohol-Tobacco-Diet     Alcohol drinks/day: 0     Tobacco Status: never     Diet Comments: generally healthy  Exercise-Depression-Behavior     Have you felt down or hopeless? no     Have you felt little pleasure in things? no     Depression Counseling: not indicated; screening negative for depression     Seat Belt Use: always  Current Medications (verified): 1)  Diclofenac Sodium 75 Mg Tbec (Diclofenac Sodium) .... Take 1 Tablet By Mouth Twice A Day 2)  Fish Oil Concentrate 1000 Mg  Caps (Omega-3 Fatty Acids) .... One By Mouth Daily 3)  Miralax   Powd (Polyethylene Glycol 3350) .Marland Kitchen.. 17g By Mouth Once Daily Disp Qs One Month Supply 4)  Lofibra 134 Mg Caps (Fenofibrate Micronized) .... One By Mouth Daily With Meals 5)  Calcium-Vitamin D 500-200 Mg-Unit Tabs (Calcium Carbonate-Vitamin D) .... One Daily Otc 6)  Famotidine 20 Mg Tabs (Famotidine) .... Take 2 Tabs Daily 7)  Anusol-Hc 25 Mg Supp (Hydrocortisone Acetate) .... Use 1 Supp 5-7 Days After Bleeding Then As Needed 8)  Simvastatin 10 Mg Tabs (Simvastatin) .... One By Mouth At Bedtime 9)  Aspirin 81 Mg  Tbec (Aspirin) .... One By Mouth Every Day 10)  Triamcinolone Acetonide 0.1 % Oint (Triamcinolone Acetonide) .... Apply To Rash On Hand Two Times A Day Disp 30 Gram Tube 11)  Cyclobenzaprine Hcl 5 Mg Tabs (Cyclobenzaprine Hcl) .... One By Mouth At Bedtime As Needed Neck Muscle  Spasm.  Allergies (verified): 1)  Demerol (Meperidine Hcl) 2)  Penicillin  Past History:  Past medical, surgical, family and social histories (including risk factors) reviewed, and no changes noted (except as noted below).  Past Medical History: Reviewed history from 03/27/2009 and no changes required. gatroparesis 536.3 HYPERLIPIDEMIA-MIXED (ICD-272.4) SHINGLES (ICD-053.9) VERTIGO NOS OR DIZZINESS (ICD-780.4) REFLUX ESOPHAGITIS (ICD-530.11) PSORIASIS (ICD-696.1) IRRITABLE BOWEL SYNDROME (ICD-564.1) HYPERTRIGLYCERIDEMIA (ICD-272.1) BACK PAIN, LOW (ICD-724.2)    Past Surgical History: Reviewed history from 03/27/2009 and no changes required. BTL -, BTL - 04/12/2001, nl echocardiogram - 12/14/2000, nl MRI of brain - 12/14/2000  Tubal ligation in 1990.  Cholecystectomy in 1991& 1996 Back surgery on L4-5 in 1997.  Catheterization 10 years ago, she thinks.  Family History: Reviewed history from 09/05/2010 and no changes required. DM,  CVA  NO COLON CANCERS OR POLYPS  Social History: Reviewed history from 03/27/2009 and no changes required. Tobacco Use - No.  Alcohol Use - no Regular Exercise - yes  -- Walks 1 mile 4x/week Full Time Divorced   Physical Exam  General:  Well-developed,well-nourished,in no acute distress; alert,appropriate and cooperative throughout examination Msk:  Nl strength.  Pos impingement.  Also has mid arm pain which may be due to shoulder or neck problem Skin:  Typical  for dyshydrotic eczema   Impression & Recommendations:  Problem # 1:  ROTATOR CUFF SYNDROME (ICD-726.10) Clinical exam consistent with mild rotator cuf syndrome Orders: Radiology other (Radiology Other) [facility]- Est  Level 4 ([address]30)  Problem # 2:  DISC DISEASE, CERVICAL (ICD-722.4) Worried that mid arm pain is radiculopathy. Orders: Radiology other (Radiology Other) [facility]- Est  Level 4 (16010)  Problem # 3:  HYPERLIPIDEMIA-MIXED (ICD-272.4)  Both high triglycerides and  high LDL Her updated medication list for this problem includes:    Lofibra 134 Mg Caps (Fenofibrate micronized) ..... One by mouth daily with meals    Simvastatin 10 Mg Tabs (Simvastatin) ..... 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 Take 1 tablet by mouth twice a day 2)  Fish Oil Concentrate 1000 Mg Caps (Omega-3 fatty acids) .... One by mouth daily 3)  Miralax Powd (Polyethylene glycol 3350) .Marland Kitchen.. 17g by mouth once daily disp qs one month supply 4)  Lofibra 134 Mg Caps (Fenofibrate micronized) .... One by mouth daily with meals 5)  Calcium-vitamin D 500-200 Mg-unit Tabs (Calcium carbonate-vitamin d) .... One daily otc 6)  Famotidine 20 Mg Tabs (Famotidine) .... Take 2 tabs daily 7)  Anusol-hc 25 Mg Supp (Hydrocortisone acetate) .... Use 1 supp 5-7 days after bleeding then as needed 8)  Simvastatin 10 Mg Tabs (Simvastatin) .... One by mouth at bedtime 9)  Aspirin 81 Mg Tbec (Aspirin) .... One by mouth every day 10)  Triamcinolone Acetonide 0.1 % Oint (Triamcinolone acetonide) .... Apply to rash on hand two times a day disp 30 gram tube 11)  Cyclobenzaprine Hcl 5 Mg Tabs (Cyclobenzaprine hcl) .... One by mouth at bedtime as needed neck muscle spasm. Prescriptions: CYCLOBENZAPRINE HCL 5 MG TABS (CYCLOBENZAPRINE HCL) one by mouth at bedtime as needed neck muscle spasm.  #30 x 3   Entered and Authorized by:   Doralee Albino MD   Signed by:   Doralee Albino MD on 10/21/2010   Method used:   Electronically to        [facility]* (retail)       [address]       [address] [place] Shipping/mailing       [place], [place]  [address]       Ph: [phone]       Fax: [phone]   RxID:   [id number] TRIAMCINOLONE ACETONIDE 0.1 % OINT (TRIAMCINOLONE ACETONIDE)  apply to rash on hand two times a day Disp 30 gram tube  #30 x 1   Entered and Authorized by:   Doralee Albino MD   Signed by:   Doralee Albino MD on 10/21/2010   Method used:   Electronically to        [facility]* (retail)       [address]       [address] [place] Shipping/mailing       [place], [place]  [address]       Ph: [phone]       Fax: [phone]   RxID:   [id number] SIMVASTATIN 10 MG TABS (SIMVASTATIN) one by mouth at bedtime  #90 x 3   Entered and Authorized by:   Doralee Albino MD   Signed by:   Doralee Albino MD  on 10/21/2010   Method used:   Electronically to        [facility]* (retail)       [address]       [address] Shipping/mailing       [place], [place]  [address]       Ph: [phone]       Fax: [phone]   RxID:   [id number]    Orders Added: 1)  Radiology other [Radiology Other] 2)  [facility]- Est  Level 4 [[address]]   Immunization History:  Influenza Immunization History:    Influenza:  historical (10/13/2010)   Immunization History:  Influenza Immunization History:    Influenza:  Historical (10/13/2010)   Prevention & Chronic Care Immunizations   Influenza vaccine: Historical  (10/13/2010)   Influenza vaccine due: 10/15/2008    Tetanus booster: 03/18/2006: Done.   Tetanus booster due: 03/18/2016    Pneumococcal vaccine: Not documented  Colorectal Screening   Hemoccult: Done.  (09/18/1999)   Hemoccult action/deferral: Not indicated  (08/20/2009)   Hemoccult due: 09/17/2000    Colonoscopy: DONE  (10/05/2010)   Colonoscopy due: 10/06/2015  Other Screening   Pap smear: Specimen Adequacy: Satisfactory for evaluation.   Interpretation/Result:Negative for intraepithelial Lesion or Malignancy.     (03/18/2010)   Pap smear due: 03/18/2013    Mammogram: ASSESSMENT: Negative - BI-RADS 1^MM DIGITAL SCREENING  (03/18/2010)   Mammogram due: 03/19/2011   Smoking status: never   (10/21/2010)  Lipids   Total Cholesterol: 228  (10/13/2010)   LDL: 142  (10/13/2010)   LDL Direct: Not documented   HDL: 42  (10/13/2010)   Triglycerides: 221  (10/13/2010)    SGOT (AST): 23  (10/13/2010)   SGPT (ALT): 23  (10/13/2010)   Alkaline phosphatase: 66  (10/13/2010)   Total bilirubin: 0.4  (10/13/2010)    Lipid flowsheet reviewed?: Yes   Progress toward LDL goal: Unchanged  Self-Management Support :   Personal Goals (by the next clinic visit) :      Personal LDL goal: 130  (08/20/2009)    Lipid self-management support: Written self-care plan  (05/20/2010)

## 2011-01-17 NOTE — Assessment & Plan Note (Signed)
History of Present Illness Primary GI MD: Elie Goody MD [facility] Chief Complaint: BRB with soft stool since last Wednesday. Pt states she has know hemorrhoids but this feels different. Pt has a rectal burning and stinging.  History of Present Illness:   Michele Carter 50 YO FEMALE, KNOWN TO DR. Russella Dar. SHE HAD A COLONOSCOPY ABOUT 6 YEARS AGO WHICH WAS NORMAL. SHE WAS SEEN IN 2009 WITH BRB PER RECTUM -FELT AT THAT TIME SECONDARY TO EXTERNAL HEMORRHOIDS.  SHE COMES IN TODAY AFTER ONSET LAST WEEK WITH BRB PER RECTUM  WITH BM'S ON 2 CONSECUTIVE DAYS.SHE HAD SOFT STOOLS-BUT FELT SOME INTERNAL RECTAL BURNING,STINGING. SHE THINKS SHE SAW SOME BLOOD MIXED IN THE STOOL AS WELL. SHE ALSO SAW BLOOD ON 9/16 WHICH SEEMED TO BE MORE, AND A SMALL AMT YESTERDAY. NO BM OR BLEEDING TODAY-NO CURRENT RECTAL DISCOMFORT.  SEH DENIES ANY ABDOMINAL PAIN, SAYS SHE STRUGGLES WITH CHRONIC CONSTIPATION-USES MIRALAX AS NEEDED,BUT IT USUALLY TAKES A COUPLE DAYS TO WORK.   GI Review of Systems      Denies abdominal pain, acid reflux, belching, bloating, chest pain, dysphagia with liquids, dysphagia with solids, heartburn, loss of appetite, nausea, vomiting, vomiting blood, weight loss, and  weight gain.      Reports hemorrhoids and  rectal bleeding.     Denies anal fissure, black tarry stools, change in bowel habit, constipation, diarrhea, diverticulosis, fecal incontinence, heme positive stool, irritable bowel syndrome, jaundice, light color stool, liver problems, and  rectal pain.    Current Medications (verified): 1)  Diclofenac Sodium 75 Mg Tbec (Diclofenac Sodium) .... Take 1 Tablet By Mouth Twice A Day 2)  Fish Oil Concentrate 1000 Mg  Caps (Omega-3 Fatty Acids) .... One By Mouth Three Times A Day 3)  Miralax   Powd (Polyethylene Glycol 3350) .Marland Kitchen.. 17g By Mouth Once Daily Disp Qs One Month Supply 4)  Lofibra 134 Mg Caps (Fenofibrate Micronized) .... One By Mouth Daily With Meals 5)  Calcium-Vitamin D 500-200 Mg-Unit  Tabs (Calcium Carbonate-Vitamin D) .... One Daily Otc 6)  Famotidine 20 Mg Tabs (Famotidine) .... Take 2 Tabs Daily  Allergies (verified): 1)  Demerol (Meperidine Hcl) 2)  Penicillin  Past History:  Past Medical History: Reviewed history from 03/27/2009 and no changes required. gatroparesis 536.3 HYPERLIPIDEMIA-MIXED (ICD-272.4) SHINGLES (ICD-053.9) VERTIGO NOS OR DIZZINESS (ICD-780.4) REFLUX ESOPHAGITIS (ICD-530.11) PSORIASIS (ICD-696.1) IRRITABLE BOWEL SYNDROME (ICD-564.1) HYPERTRIGLYCERIDEMIA (ICD-272.1) BACK PAIN, LOW (ICD-724.2)    Past Surgical History: Reviewed history from 03/27/2009 and no changes required. BTL -, BTL - 04/12/2001, nl echocardiogram - 12/14/2000, nl MRI of brain - 12/14/2000  Tubal ligation in 1990.  Cholecystectomy in 1991& 1996 Back surgery on L4-5 in 1997.  Catheterization 10 years ago, she thinks.  Family History: Reviewed history from 02/14/2007 and no changes required. DM,  CVA  NO COLON CANCERS OR POLYPS  Social History: Reviewed history from 03/27/2009 and no changes required. Tobacco Use - No.  Alcohol Use - no Regular Exercise - yes  -- Walks 1 mile 4x/week Full Time Divorced   Review of Systems  The patient denies allergy/sinus, anemia, anxiety-new, arthritis/joint pain, back pain, blood in urine, breast changes/lumps, change in vision, confusion, cough, coughing up blood, depression-new, fainting, fatigue, fever, headaches-new, hearing problems, heart murmur, heart rhythm changes, itching, menstrual pain, muscle pains/cramps, night sweats, nosebleeds, pregnancy symptoms, shortness of breath, skin rash, sleeping problems, sore throat, swelling of feet/legs, swollen lymph glands, thirst - excessive , urination - excessive , urination changes/pain, urine leakage, vision changes, and  voice change.         SEE HPI  Vital Signs:  Patient profile:   50 year old female Height:      66 inches Weight:      178.50 pounds BMI:      28.91 Pulse rate:   76 / minute Pulse rhythm:   regular BP sitting:   108 / 68  (right arm) Cuff size:   regular  Vitals Entered By: Christie Nottingham CMA Duncan Dull) (September 05, 2010 2:04 PM)  Physical Exam  General:  Well developed, well nourished, no acute distress. Head:  Normocephalic and atraumatic. Eyes:  PERRLA, no icterus. Lungs:  Clear throughout to auscultation. Heart:  Regular rate and rhythm; no murmurs, rubs,  or bruits. Abdomen:  SOFT, NONTENDER, NO MASS OR HSM,BS+ Rectal:  FORMED STOOL IN VAULT,BROWN HEME POSITIVE, EXTERNAL HEMORRHOIDS, SMALL INFLAMED BLUISH INTERNAL HEMORRHOID. Extremities:  No clubbing, cyanosis, edema or deformities noted. Neurologic:  Alert and  oriented x4;  grossly normal neurologically. Psych:  Alert and cooperative. Normal mood and affect.   Impression & Recommendations:  Problem # 1:  RECTAL BLEEDING (ICD-569.3) Assessment New 50 YO FEMALE WITH C/O BRB PER RECTUM OVER THE PAST WEEK,ASSOCIATED MILD RECTAL DISCOMFORT,CHRONIC CONSTIPATION. SUSPECT BLEEDING SECONDARY TO INTERNAL HEMORRHOID, HOWEVER CANNOT R/O OTHER OCCULT COLON PATHOLOGY/OCCULT LESION.  ANUSOL HC SUPP at bedtime  AS NEEDED IF RECURRENT BLEEDING SCHEDULE FOR COLONOSCOPY WITH DR. Russella Dar -PROCEDURE DISCUSSED IN DETAIL WITH THE PT AND SHE IS COMFORTABLE WITH THIS PLAN. Orders: Colonoscopy (Colon)  Problem # 2:  CONSTIPATION (ICD-564.00) Assessment: Unchanged CHRONIC-ADVISED TRIAL OF MIRALAX AT 1 1/2  THE USUAL 17 GM DOSE PER DAY.  SHE HAS TRIED ALIGN IN THE PAST WITHOUT BENEFIT.  Problem # 3:  HYPERLIPIDEMIA-MIXED (ICD-272.4) Assessment: Comment Only  Patient Instructions: 1)  Stay on the Miralax 1 1/2 doses daily. 2)  We sent prescriptions to [facility] for the Colonscopy prep and the Anusol Suppositories. 3)  We scheduled the Colonoscopy with Dr Russella Dar on 11-01-10. 4)  Directions and brochure provided. 5)  [facility] Patient Information  Guide given to patient. 6)  The medication list was reviewed and reconciled.  All changed / newly prescribed medications were explained.  A complete medication list was provided to the patient / caregiver. Prescriptions: ANUSOL-HC 25 MG SUPP (HYDROCORTISONE ACETATE) Use 1 supp 5-7 days after bleeding then as needed  #10 x 1   Entered by:   Lowry Ram NCMA   Authorized by:   Sammuel Cooper PA-c   Signed by:   Lowry Ram NCMA on 09/05/2010   Method used:   Electronically to        [facility]* (retail)       [address]       [address] Shipping/mailing       [place], [place]  [address]       Ph: [phone]       Fax: [phone]   RxID:   [id number] MOVIPREP 100 GM  SOLR (PEG-KCL-NACL-NASULF-NA ASC-C) As per prep instructions.  #1 x 0   Entered by:   Lowry Ram NCMA   Authorized by:   Sammuel Cooper PA-c   Signed by:   Lowry Ram NCMA on 09/05/2010   Method used:   Electronically to        [facility]* (retail)       [address]       [address] Shipping/mailing       [place], [place]  [address]       Ph: [phone]       Fax: [phone]   RxID:   [id number]

## 2011-01-19 ENCOUNTER — Encounter: Payer: Self-pay | Admitting: Family Medicine

## 2011-01-19 ENCOUNTER — Other Ambulatory Visit (INDEPENDENT_AMBULATORY_CARE_PROVIDER_SITE_OTHER): Payer: Commercial Managed Care - PPO

## 2011-01-19 DIAGNOSIS — E785 Hyperlipidemia, unspecified: Secondary | ICD-10-CM

## 2011-01-19 LAB — CONVERTED CEMR LAB
Chloride: 106 meq/L (ref 96–112)
Creatinine, Ser: 0.87 mg/dL (ref 0.40–1.20)
Potassium: 4.8 meq/L (ref 3.5–5.3)
Sodium: 144 meq/L (ref 135–145)
Total Bilirubin: 0.4 mg/dL (ref 0.3–1.2)
Total Protein: 7.3 g/dL (ref 6.0–8.3)

## 2011-01-19 NOTE — Assessment & Plan Note (Signed)
Summary: f/u ED visit of chest pain/eo   Vital Signs:  Patient profile:   50 year old female Weight:      174 pounds Temp:     98.7 degrees F oral Pulse rate:   76 / minute Pulse rhythm:   regular BP sitting:   129 / 80  (left arm) Cuff size:   regular  Vitals Entered By: Loralee Pacas CMA (December 23, 2010 2:20 PM)  History of Present Illness: Intermitant tingling Lt side of face for years.  See previous phone note , Has history of abnormal carotid test at some point.  - Had Shingles in thhat general region but not a precise match for tingling distibution Never had motor symptoms only sensory.  Tingling has only been located in the jaw area, not neck or arm.  Low risk for athersclerosis based on risk factors.  No tooth problems, no history of trauma  Current Medications (verified): 1)  Diclofenac Sodium 75 Mg Tbec (Diclofenac Sodium) .... Take 1 Tablet By Mouth Twice A Day 2)  Fish Oil Concentrate 1000 Mg  Caps (Omega-3 Fatty Acids) .... One By Mouth Daily 3)  Miralax   Powd (Polyethylene Glycol 3350) .Marland Kitchen.. 17g By Mouth Once Daily Disp Qs One Month Supply 4)  Lofibra 134 Mg Caps (Fenofibrate Micronized) .... One By Mouth Daily With Meals 5)  Calcium-Vitamin D 500-200 Mg-Unit Tabs (Calcium Carbonate-Vitamin D) .... One Daily Otc 6)  Famotidine 20 Mg Tabs (Famotidine) .... Take 2 Tabs Daily 7)  Anusol-Hc 25 Mg Supp (Hydrocortisone Acetate) .... Use 1 Supp 5-7 Days After Bleeding Then As Needed 8)  Simvastatin 10 Mg Tabs (Simvastatin) .... One By Mouth At Bedtime 9)  Aspirin 81 Mg  Tbec (Aspirin) .... One By Mouth Every Day 10)  Triamcinolone Acetonide 0.1 % Oint (Triamcinolone Acetonide) .... Apply To Rash On Hand Two Times A Day Disp 30 Gram Tube 11)  Cyclobenzaprine Hcl 5 Mg Tabs (Cyclobenzaprine Hcl) .... One By Mouth At Bedtime As Needed Neck Muscle Spasm. 12)  Gabapentin 100 Mg Caps (Gabapentin) .... One By Mouth Three Times A Day (For Neck and Arm Pain)  Allergies (verified): 1)   Demerol (Meperidine Hcl) 2)  Penicillin  Physical Exam  General:  Well-developed,well-nourished,in no acute distress; alert,appropriate and cooperative throughout examination Head:  Normocephalic and atraumatic without obvious abnormalities. No apparent alopecia or balding. Ears:  External ear exam shows no significant lesions or deformities.  Otoscopic examination reveals clear canals, tympanic membranes are intact bilaterally without bulging, retraction, inflammation or discharge. Hearing is grossly normal bilaterally. Nose:  External nasal examination shows no deformity or inflammation. Nasal mucosa are pink and moist without lesions or exudates. Mouth:  Oral mucosa and oropharynx without lesions or exudates.  Teeth in good repair. Neck:  No deformities, masses, or tenderness noted. Lungs:  Normal respiratory effort, chest expands symmetrically. Lungs are clear to auscultation, no crackles or wheezes. Heart:  Normal rate and regular rhythm. S1 and S2 normal without gallop, murmur, click, rub or other extra sounds. Neurologic:  No cranial nerve deficits noted. Station and gait are normal. Plantar reflexes are down-going bilaterally. DTRs are symmetrical throughout. Sensory, motor and coordinative functions appear intact.   Impression & Recommendations:  Problem # 1:  NEUROPATHY (ICD-355.9)  Facial symptoms seem like some sort of neuropathy or nerve irritation.  Given atypical hx and normal exam, I do not intend to pursue diagnosis of TIA further.  Orders: [facility]- Est Level  3 ([address])  Complete Medication  List: 1)  Diclofenac Sodium 75 Mg Tbec (Diclofenac sodium) .... Take 1 tablet by mouth twice a day 2)  Fish Oil Concentrate 1000 Mg Caps (Omega-3 fatty acids) .... One by mouth daily 3)  Miralax Powd (Polyethylene glycol 3350) .Marland Kitchen.. 17g by mouth once daily disp qs one month supply 4)  Lofibra 134 Mg Caps (Fenofibrate micronized) .... One by mouth daily with meals 5)  Calcium-vitamin D  500-200 Mg-unit Tabs (Calcium carbonate-vitamin d) .... One daily otc 6)  Famotidine 20 Mg Tabs (Famotidine) .... Take 2 tabs daily 7)  Anusol-hc 25 Mg Supp (Hydrocortisone acetate) .... Use 1 supp 5-7 days after bleeding then as needed 8)  Simvastatin 10 Mg Tabs (Simvastatin) .... One by mouth at bedtime 9)  Aspirin 81 Mg Tbec (Aspirin) .... One by mouth every day 10)  Triamcinolone Acetonide 0.1 % Oint (Triamcinolone acetonide) .... Apply to rash on hand two times a day disp 30 gram tube 11)  Cyclobenzaprine Hcl 5 Mg Tabs (Cyclobenzaprine hcl) .... One by mouth at bedtime as needed neck muscle spasm. 12)  Gabapentin 100 Mg Caps (Gabapentin) .... One by mouth three times a day (for neck and arm pain)  Other Orders: Future Orders: Direct LDL-[facility] ([id number]) ... 12/21/2011 Comp Met-[facility] ([id number]) ... 12/21/2011   Orders Added: 1)  Direct LDL-[facility] [[id number]] 2)  Comp Met-[facility] [[id number]] 3)  [facility]- Est Level  3 [[address]]     Prevention & Chronic Care Immunizations   Influenza vaccine: Historical  (10/13/2010)   Influenza vaccine due: 10/15/2008    Tetanus booster: 03/18/2006: Done.   Tetanus booster due: 03/18/2016    Pneumococcal vaccine: Not documented  Colorectal Screening   Hemoccult: Done.  (09/18/1999)   Hemoccult action/deferral: Not indicated  (08/20/2009)   Hemoccult due: 09/17/2000    Colonoscopy: DONE  (10/05/2010)   Colonoscopy due: 10/06/2015  Other Screening   Pap smear: Specimen Adequacy: Satisfactory for evaluation.   Interpretation/Result:Negative for intraepithelial Lesion or Malignancy.     (03/18/2010)   Pap smear due: 03/18/2013    Mammogram: ASSESSMENT: Negative - BI-RADS 1^MM DIGITAL SCREENING  (03/18/2010)   Mammogram due: 03/19/2011   Smoking status: never  (10/21/2010)  Lipids   Total Cholesterol: 228  (10/13/2010)   LDL: 142  (10/13/2010)   LDL Direct: Not documented   HDL: 42  (10/13/2010)   Triglycerides: 221  (10/13/2010)     SGOT (AST): 23  (10/13/2010)   SGPT (ALT): 23  (10/13/2010) CMP ordered    Alkaline phosphatase: 66  (10/13/2010)   Total bilirubin: 0.4  (10/13/2010)    Lipid flowsheet reviewed?: Yes   Progress toward LDL goal: Unchanged  Self-Management Support :   Personal Goals (by the next clinic visit) :      Personal LDL goal: 130  (08/20/2009)    Lipid self-management support: Written self-care plan  (05/20/2010)

## 2011-01-19 NOTE — Progress Notes (Signed)
Summary: Update on pt   Phone Note Call from Patient Call back at Home Phone [phone]   Reason for Call: Talk to Doctor Summary of Call: pt thought she had "TIAs" in the past, had tingling in her face,  & had a blocked artery, had a cath done but nothing was there, must have been a shadow on the xray, pt was a little tense today while working & felt flushed in her face with tingling, just wanted to share this with Dr. Leveda Anna.  Initial call taken by: Knox Royalty,  December 15, 2010 3:00 PM  Follow-up for Phone Call        Hx of abnormal carotid study 15 years ago, but then follow up study showed no problem.  Having episodes of facial tingling.  Still on aspirin and lipid lowering agents.  Will see me 1/6.  Will arrange carotid dopplets before that visit Follow-up by: Doralee Albino MD,  December 16, 2010 9:12 AM  Additional Follow-up for Phone Call Additional follow up Details #1::        appt made. pt informed.   she wanted you to know that she may have given you the wrong dates before she had this study done  and her last name was Gaye Alken if you would like to look it up 2001, or 2002 ias when she had this done Additional Follow-up by: Loralee Pacas CMA,  December 16, 2010 11:52 AM  New Problems: TRANSIENT ISCHEMIC ATTACK (ICD-435.9)   New Problems: TRANSIENT ISCHEMIC ATTACK (ICD-435.9)

## 2011-01-20 ENCOUNTER — Other Ambulatory Visit: Payer: Self-pay

## 2011-02-07 ENCOUNTER — Other Ambulatory Visit: Payer: Self-pay | Admitting: Obstetrics and Gynecology

## 2011-02-07 DIAGNOSIS — Z1231 Encounter for screening mammogram for malignant neoplasm of breast: Secondary | ICD-10-CM

## 2011-03-21 ENCOUNTER — Ambulatory Visit
Admission: RE | Admit: 2011-03-21 | Discharge: 2011-03-21 | Disposition: A | Discharge status: Home / Self Care | Payer: Commercial Managed Care - PPO | Source: Ambulatory Visit | Attending: Obstetrics and Gynecology | Admitting: Obstetrics and Gynecology

## 2011-03-21 DIAGNOSIS — Z1231 Encounter for screening mammogram for malignant neoplasm of breast: Secondary | ICD-10-CM

## 2011-05-02 NOTE — Assessment & Plan Note (Signed)
[facility]                         GASTROENTEROLOGY OFFICE NOTE   [name], [name]                     MRN:          [id number]  DATE:01/27/2008                            DOB:          1961-08-28    [name] complains of mild constipation and small volume bright red  blood per rectum.  She states she has noticed bulging hemorrhoids on  occasion.  Her symptoms have been active for the past several weeks.  She also complains of anorectal itching.  She generally has about 3  bowel movements per week.  She underwent colonoscopy in June of 2005  which was normal, and she has been treated for constipation-predominant  irritable bowel syndrome.  Her reflux symptoms are well controlled on  daily Nexium.   CURRENT MEDICATIONS:  Listed on the chart, updated and reviewed.   MEDICATION ALLERGIES:  PENICILLIN and DEMEROL.   PHYSICAL EXAMINATION:  No acute distress.  Weight 169 pounds, blood pressure is 104/70, pulse 64 and regular.  CHEST:  Clear to auscultation bilaterally.  CARDIAC:  Regular rate and rhythm without murmurs.  ABDOMEN:  Soft and nontender with normoactive bowel sounds.  No palpable  organomegaly, masses or hernias.  RECTAL EXAMINATION:  Reveals small external hemorrhoids, no internal  lesions, and Hemoccult negative brown stool in the vault.  There is no  significant tenderness.   ASSESSMENT AND PLAN:  1. External hemorrhoids.  Begin Anusol HC suppositories nightly for 7      to 10 days in case there are small internal hemorrhoids, and      Analpram 2.5% HC cream b.i.d. for 7 to 10 days and then p.r.n.  She      is to maintain a high-fiber diet with adequate fluid intake.      Return office visit in 4 to 6 weeks if her symptoms have not      completely resolved.  2. Gastroesophageal reflux disease.  Symptoms under excellent control.      Renew Nexium 40 mg p.o. q.a.m. and maintain standard antireflux      measures.  Return office  visit in 1 year.     Venita Lick. Russella Dar, MD, [facility]  Electronically Signed    MTS/MedQ  DD: 02/04/2008  DT: 02/05/2008  Job #: [id number]

## 2011-05-02 NOTE — Assessment & Plan Note (Signed)
[facility]                            CARDIOLOGY OFFICE NOTE   LANEICE, MENEELY                     MRN:          [id number]  DATE:09/04/2007                            DOB:          15-Jan-1961    REASON FOR VISIT:  Michele Carter is a delightful, 50 year old, divorced,  white female who comes in today referred by Dr. Cherly Hensen to consult on  her high triglycerides and low HDL.   HISTORY OF PRESENT ILLNESS:  She is 50 years of age and extremely health  conscious.  She works out four times a week in a gym.  She has struggled  a little bit with her weight, but she has lost 17 pounds over the last  several months.   Her triglycerides in March were 298, cholesterol 203, HDL 40, LDL 103.  After losing 17 pounds, her triglycerides actually went up a little bit  to 315 which really discouraged her.  Her VLDL was 63.  She denies any  symptoms of angina or ischemia.   Her diet is extremely healthy, although she does eat sweets, white pasta  several times a week, carrots on a daily basis, as well as fruits on a  regular basis.  She was not familiar with the glycemic index.   PAST MEDICAL HISTORY:  She does not smoke, does not drink.  She  exercises on a regular basis.  She does not have hypertension, diabetes  or premature family history.   ALLERGIES:  PENICILLIN.  She has no dye allergies.   PAST SURGICAL HISTORY:  1. Tubal ligation in 1990.  2. Cholecystectomy in 1991.  3. Back surgery on L4-5 in 1997.  4. Catheterization 10 years ago, she thinks.   FAMILY HISTORY:  Negative for premature cardiac disease.   SOCIAL HISTORY:  She is an Print production planner at [employer].  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  She is divorced.  She has one child.   REVIEW OF SYSTEMS:  She has history of gastroesophageal reflux,  otherwise negative.   PHYSICAL EXAMINATION:  GENERAL:  She is very pleasant.  She is in no  acute distress.  She is alert and oriented x3.  VITAL SIGNS:  Blood pressure 108/68, pulse 78 and regular.  She is 5  feet 6 inches, weight 165 pounds.  HEENT:  Normocephalic, atraumatic.  PERRLA.  Extraocular movements  intact.  Sclerae clear.  Facial asymmetry is normal.  Dentition is  satisfactory.  NECK:  Carotid upstrokes are equal bilaterally without bruits.  No JVD.  Thyroid not enlarged.  Trachea midline.  LUNGS:  Clear.  HEART:  Nondisplaced PMI.  She has normal S1, S2 without murmurs, rubs  or gallops.  ABDOMEN:  Soft.  There is no midline bruit.  There is no hepatomegaly.  EXTREMITIES:  No clubbing, cyanosis or edema.  Pulses are intact.  NEUROLOGIC:  Intact.  SKIN:  Unremarkable.   EKG is completely normal.   ASSESSMENT/PLAN:  Mixed hyperlipidemia.  Some of this may be genetic,  but I think much is diet-induced.  The minimal difference in her  triglycerides  was probably from the standard error of the test.  We  talked a long time about the quality of what she is eating.  We talked  about a low glycemic index diet and I have give her several web sites to  look at.  She clearly needs to cut back on the white pasta, sweets,  carrots as well as excess fruit.  If she continues to lose weight, I  think her triglycerides will drop well below 200.  Her HDL should  probably increase to the mid 40s.   Calculating her risk for premature coronary event is extremely low.  I  would rate it around 1% to 2%.  Even in 10 years, all things staying  equal, it will be probably less than 5%.  She felt good about this.   We will see her back on a p.r.n. basis.     Thomas C. Daleen Squibb, MD, [facility]  Electronically Signed    TCW/MedQ  DD: 09/04/2007  DT: 09/05/2007  Job #: [id number]   cc:   Maxie Better, M.D.

## 2011-05-22 ENCOUNTER — Telehealth: Payer: Self-pay | Admitting: Family Medicine

## 2011-05-22 NOTE — Telephone Encounter (Signed)
Michele Carter want to know when she should have her next lipid panel.  Please call her with info and put in order for lab.  She will call to set up appt after hearing from you.

## 2011-05-23 NOTE — Telephone Encounter (Signed)
Called.  Next lipid panel due after 10/14/11

## 2011-05-30 ENCOUNTER — Other Ambulatory Visit: Payer: Self-pay | Admitting: Family Medicine

## 2011-05-30 NOTE — Telephone Encounter (Signed)
Refill request

## 2011-06-26 ENCOUNTER — Other Ambulatory Visit: Payer: Self-pay | Admitting: Family Medicine

## 2011-06-26 NOTE — Telephone Encounter (Signed)
Refill request

## 2011-06-30 ENCOUNTER — Ambulatory Visit (HOSPITAL_COMMUNITY)
Admission: RE | Admit: 2011-06-30 | Discharge: 2011-06-30 | Disposition: A | Discharge status: Home / Self Care | Payer: 59 | Source: Ambulatory Visit | Attending: Family Medicine | Admitting: Family Medicine

## 2011-06-30 ENCOUNTER — Telehealth: Payer: Self-pay | Admitting: Family Medicine

## 2011-06-30 ENCOUNTER — Ambulatory Visit (INDEPENDENT_AMBULATORY_CARE_PROVIDER_SITE_OTHER): Payer: Commercial Managed Care - PPO | Admitting: Family Medicine

## 2011-06-30 ENCOUNTER — Encounter: Payer: Self-pay | Admitting: Family Medicine

## 2011-06-30 ENCOUNTER — Other Ambulatory Visit: Payer: Self-pay

## 2011-06-30 DIAGNOSIS — R079 Chest pain, unspecified: Secondary | ICD-10-CM | POA: Insufficient documentation

## 2011-06-30 DIAGNOSIS — K2100 Gastro-esophageal reflux disease with esophagitis, without bleeding: Secondary | ICD-10-CM

## 2011-06-30 MED ORDER — ESOMEPRAZOLE MAGNESIUM 40 MG PO CPDR: 40.0000 mg | DELAYED_RELEASE_CAPSULE | Freq: Every day | ORAL | Status: DC

## 2011-06-30 NOTE — Assessment & Plan Note (Signed)
Chest pain is non anginal.  With her age and risk factors, she has low pretest probability of CAD.  EKG neg.  Will observe.  Suspect most likely due to GERD.  See below.

## 2011-06-30 NOTE — Telephone Encounter (Signed)
pts asking to speak with RN about a pain she has been having at night, wanted appt asap but doesn't know if she should wait.

## 2011-06-30 NOTE — Assessment & Plan Note (Signed)
Likely cause of chest pain.  Will restart PPI (recently switched to pepcid in attempt to prevent long term problems.)

## 2011-06-30 NOTE — Progress Notes (Signed)
  Subjective:    Patient ID: Michele Carter, female    DOB: 04-18-1961, 50 y.o.   MRN: [id number]  HPI Never smoker Not diabetic Father died 15 aneurism Oldest brother mini strokes Patient has had several nights when she awakens with upper chest and neck discomfort.  Slowly resolves after several hours.  Nothing helps.  Seems unsimilar to previous heartburn/GERD.  Denies palpitations, SOB, nausea or diaphoresis. Not exertional.  She regularly works out on treadmill and no pain.  Climbs stairs at work and gets mildly SOB but no pain. Only sig risk factor is elevated cholesterol, but this has been well controled.   Review of Systems     Objective:   Physical Exam Lungs clear Cardiac RRR without m or g Abd benign No chest wall tenderness.        Assessment & Plan:

## 2011-06-30 NOTE — Patient Instructions (Signed)
Call if worsens or seems to be related to exercise. See me in 2 weeks.

## 2011-06-30 NOTE — Telephone Encounter (Signed)
Patient states for a couple of weeks she has woken up in middle of night with chest pressure in upper chest that extends up to neck. She rates pain at a 10. Pain subsides by midmorning . She currently has pain that she now rates a 4. Consulted with Dr. Leveda Anna and she will come to office now for appointment.

## 2011-07-19 ENCOUNTER — Telehealth: Payer: Self-pay | Admitting: Family Medicine

## 2011-07-19 NOTE — Telephone Encounter (Signed)
Spoke with patient and she continues having discomfort mostly in back and mid section she states. She is referring to area under breast down to [employer] area. . She is taking nexium. States discomfort starts during the night and by 10:00 AM the next day discomfort is gone. She is wondering if it is muscle related , due to way she is laying during night. Last night she laid in same position for 6 hours and when she got up to go to bathroom she could hardly move. Then later she laid on her right arm and that was hurting also when she got up this AM. She is just wondering if the statin she started in Oct could be having any efferts on her muscles. Will forward to Dr. Leveda Anna.

## 2011-07-19 NOTE — Telephone Encounter (Signed)
Pt saw Hensel on 7/13 - she is still having chest pains - Hensel had told her to call to let him know how she is doing.  She would like to talk to nurse

## 2011-07-20 ENCOUNTER — Telehealth: Payer: Self-pay | Admitting: Family Medicine

## 2011-07-20 NOTE — Assessment & Plan Note (Signed)
Really more abd pain than chest pain at this point.

## 2011-07-20 NOTE — Telephone Encounter (Signed)
Having pain, mostly at night and on awakening.  Pain is Rt sided located in the upper abd.  No nausea, vomiting or shortness of breath.  Will check CXR and blood work

## 2011-07-20 NOTE — Telephone Encounter (Signed)
Informed pt that she can walk in for CXR the order is in the computer and she will come in the morning to have labs drawn.pt agreed.Laureen Ochs, Viann Shove

## 2011-07-21 ENCOUNTER — Other Ambulatory Visit: Payer: Commercial Managed Care - PPO

## 2011-07-21 ENCOUNTER — Ambulatory Visit (HOSPITAL_COMMUNITY)
Admission: RE | Admit: 2011-07-21 | Discharge: 2011-07-21 | Disposition: A | Discharge status: Home / Self Care | Payer: 59 | Source: Ambulatory Visit | Attending: Family Medicine | Admitting: Family Medicine

## 2011-07-21 DIAGNOSIS — M545 Low back pain, unspecified: Secondary | ICD-10-CM

## 2011-07-21 DIAGNOSIS — R0789 Other chest pain: Secondary | ICD-10-CM | POA: Insufficient documentation

## 2011-07-21 LAB — POCT URINALYSIS DIPSTICK
Blood, UA: NEGATIVE
Nitrite, UA: NEGATIVE
Protein, UA: NEGATIVE
pH, UA: 5.5

## 2011-07-21 LAB — POCT UA - MICROSCOPIC ONLY

## 2011-07-21 LAB — LIPASE: Lipase: 21 U/L (ref 0–75)

## 2011-07-21 NOTE — Progress Notes (Signed)
CMET, lipase, and urine done BAJORDAN, MLS

## 2011-07-21 NOTE — Progress Notes (Signed)
Cmp,lipase and poct ua done today [facility] Michele Carter

## 2011-07-22 LAB — COMPLETE METABOLIC PANEL WITH GFR
ALT: 20 U/L (ref 0–35)
AST: 23 U/L (ref 0–37)
BUN: 20 mg/dL (ref 6–23)
Creat: 0.86 mg/dL (ref 0.50–1.10)
GFR, Est African American: >60 mL/min (ref >60)
Total Bilirubin: 0.4 mg/dL (ref 0.3–1.2)

## 2011-08-23 ENCOUNTER — Other Ambulatory Visit: Payer: Self-pay | Admitting: Family Medicine

## 2011-08-23 NOTE — Telephone Encounter (Signed)
Refill request

## 2011-08-28 ENCOUNTER — Telehealth: Payer: Self-pay | Admitting: Family Medicine

## 2011-08-28 NOTE — Telephone Encounter (Signed)
Continues to have bilateral hand pain.  States that it takes her at least 2 hours before she can functionally use them.  Was wondering if she has RA.  Told her it would be best if she was seen.  Gave her an appointment with Dr. Leveda Anna for next Wednesday at 3:45 pm.

## 2011-08-28 NOTE — Telephone Encounter (Signed)
Pt is requesting to speak with nurse.  Having problems with her hands.

## 2011-09-06 ENCOUNTER — Ambulatory Visit (INDEPENDENT_AMBULATORY_CARE_PROVIDER_SITE_OTHER): Payer: 59 | Admitting: Family Medicine

## 2011-09-06 ENCOUNTER — Encounter: Payer: Self-pay | Admitting: Family Medicine

## 2011-09-06 VITALS — BP 107/71 | HR 78 | Temp 98.3°F | Ht 66.0 in | Wt 180.0 lb

## 2011-09-06 DIAGNOSIS — M129 Arthropathy, unspecified: Secondary | ICD-10-CM | POA: Insufficient documentation

## 2011-09-07 ENCOUNTER — Other Ambulatory Visit: Payer: 59

## 2011-09-07 DIAGNOSIS — M129 Arthropathy, unspecified: Secondary | ICD-10-CM

## 2011-09-07 NOTE — Assessment & Plan Note (Signed)
Given other joint involvement, likely osteoarthritis.  She does have a family history of rheumatoid arthritis and ankylosing spondylitis.  Will screen for inflamatory arthropathy.

## 2011-09-07 NOTE — Progress Notes (Signed)
LABS DRAWN FOR SEDRATE, RHEUMATOID FACTOR, ANA CNEWSOME

## 2011-09-07 NOTE — Progress Notes (Signed)
  Subjective:    Patient ID: Michele Carter, female    DOB: [birth year], 50 y.o.   MRN: [id number]  HPI  Doing better with back and hip pain now that she is taking diclofenac bid.  Now having hand pain - mostly in the MCP and PIP joints.  No deformity, mild intermitant swelling.  No numbness    Review of Systems     Objective:   Physical Exam  Neg carpal tunnel testing.  Mild tenderness over all MCP and PIP joints.        Assessment & Plan:

## 2011-09-18 ENCOUNTER — Telehealth: Payer: Self-pay | Admitting: Family Medicine

## 2011-09-18 NOTE — Telephone Encounter (Signed)
Dr. Orlin Hilding with [facility] is going to need Medical Records pertaining to the Rheumatoid Arthritis DX faxed to [phone]  Attention:  Michele Carter.  Michele Carter is requesting a Release faxed to her at [phone].

## 2011-09-25 ENCOUNTER — Other Ambulatory Visit: Payer: Self-pay | Admitting: Family Medicine

## 2011-09-25 DIAGNOSIS — K2100 Gastro-esophageal reflux disease with esophagitis, without bleeding: Secondary | ICD-10-CM

## 2011-09-25 MED ORDER — ESOMEPRAZOLE MAGNESIUM 40 MG PO CPDR: 40.0000 mg | DELAYED_RELEASE_CAPSULE | Freq: Every day | ORAL | Status: DC

## 2011-09-25 NOTE — Assessment & Plan Note (Signed)
Refilled nexium after fax request

## 2011-10-23 ENCOUNTER — Other Ambulatory Visit: Payer: Self-pay | Admitting: Family Medicine

## 2011-10-23 NOTE — Telephone Encounter (Signed)
Refill request

## 2011-10-26 ENCOUNTER — Encounter: Payer: Self-pay | Admitting: Family Medicine

## 2011-10-26 DIAGNOSIS — M47899 Other spondylosis, site unspecified: Secondary | ICD-10-CM

## 2011-10-26 NOTE — Progress Notes (Signed)
  Subjective:    Patient ID: Michele Carter, female    DOB: [birth year], 50 y.o.   MRN: [id number]  HPI Patient seen by rheum(Dr. 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    Review of Systems     Objective:   Physical Exam        Assessment & Plan:

## 2011-11-24 ENCOUNTER — Encounter: Payer: Self-pay | Admitting: *Deleted

## 2011-11-24 ENCOUNTER — Encounter: Payer: 59 | Attending: Family Medicine | Admitting: *Deleted

## 2011-11-24 DIAGNOSIS — Z713 Dietary counseling and surveillance: Secondary | ICD-10-CM | POA: Insufficient documentation

## 2011-11-24 DIAGNOSIS — I10 Essential (primary) hypertension: Secondary | ICD-10-CM | POA: Insufficient documentation

## 2011-11-24 DIAGNOSIS — E785 Hyperlipidemia, unspecified: Secondary | ICD-10-CM | POA: Insufficient documentation

## 2011-11-24 DIAGNOSIS — E669 Obesity, unspecified: Secondary | ICD-10-CM | POA: Insufficient documentation

## 2011-11-24 NOTE — Patient Instructions (Signed)
Goals: Aim for 2-3 Carb Choices (30-45 grams) / meal and 1 (15 grams) / snack if hungry Read food labels for total carbohydrate and fat content of foods Decrease sugar intake contained in sweet tea, regular soda and fruit juices Continue with aerobic exercise, increase frequency throughout the week.

## 2011-11-24 NOTE — Progress Notes (Signed)
  Medical Nutrition Therapy:  Appt start time: 0900 end time:  1000.   Assessment:  Primary concerns today: patient interested in losing weight. She is already familiar with guidelines for hyperlipidemias, she exercises twice a week in an aerobic class.    MEDICATIONS: see list   DIETARY INTAKE:  Usual eating pattern includes 3 meals and 1-2 snacks per day.  Everyday foods include good variety of all food groups. .    24-hr recall:  B ( AM): bran cereal with 1% milk, small banana. Also drinks 8 oz red grape juice to take pills in AM  Snk ( AM): none  L ( PM): left overs from home or lean meat sandwich with small bag of chips Snk ( PM): occasional high protein bar before aerobics class D ( PM): lean meat, whole grain starch, 2 vegetables Snk ( PM): small dessert or 2 cookies Beverages: sweet tea or occasional regular soda, water   Usual physical activity: aerobics class for 1 hour twice weekly  Estimated energy needs: 1400 calories 158 g carbohydrates 105 g protein 39 g fat  Progress Towards Goal(s):  In progress.   Nutritional Diagnosis:  NI-1.5 Excessive energy intake As related to desire to lose weight as well as hypelipidemias.  As evidenced by BMI of 29.8.    Intervention:  Nutrition counseling provided on calorie content of various macronutrient's, goal setting for carb, protein and fat intake. Recommend decreasing sweet beverage consumption and increasing frequency of aerobic exercise per week. Goals: Aim for 2-3 Carb Choices (30-45 grams) / meal and 1 (15 grams) / snack if hungry Read food labels for total carbohydrate and fat content of foods Decrease sugar intake contained in sweet tea, regular soda and fruit juices Continue with aerobic exercise, increase frequency throughout the week.  Handouts given during visit include:  Carb Counting and Beyond handout  Label reading and Menu Planner handout  Monitoring/Evaluation:  Dietary intake, exercise, label reading,  and body weight prn.

## 2011-12-19 DIAGNOSIS — L4050 Arthropathic psoriasis, unspecified: Secondary | ICD-10-CM | POA: Insufficient documentation

## 2012-01-01 ENCOUNTER — Encounter: Payer: Self-pay | Admitting: Family Medicine

## 2012-01-01 NOTE — Progress Notes (Signed)
  Subjective:    Patient ID: Michele Carter, female    DOB: 04-10-1961, 51 y.o.   MRN: [id number]  HPI  Seen in fu by rheum and was started on enbrel    Review of Systems     Objective:   Physical Exam        Assessment & Plan:

## 2012-01-16 ENCOUNTER — Ambulatory Visit (INDEPENDENT_AMBULATORY_CARE_PROVIDER_SITE_OTHER): Payer: Self-pay | Admitting: Pharmacist

## 2012-01-16 ENCOUNTER — Encounter: Payer: Self-pay | Admitting: Pharmacist

## 2012-01-16 VITALS — BP 108/68 | Ht 65.0 in | Wt 181.0 lb

## 2012-01-16 DIAGNOSIS — L4050 Arthropathic psoriasis, unspecified: Secondary | ICD-10-CM

## 2012-01-16 NOTE — Assessment & Plan Note (Signed)
Following medication review, no suggestions for change.  Complete medication list provided to patient.  Total time in face to face medication review: 15 minutes.  Patient seen with: Eddie Saito, Pharmacy Resident.  

## 2012-01-16 NOTE — Progress Notes (Signed)
  Subjective:    Patient ID: Michele Carter, female    DOB: 1961/07/08, 51 y.o.   MRN: [id number]  HPI Patient arrives in good spirits for medication review.  Reports seeing Dr. Leveda Anna as primary care provider, Dr. Zenovia Jordan [facility]) as rheumatologist, Dr. Cherly Hensen [facility]).  Reports being recently diagnosed with Psoriatic Arthritis in October 2012 and states she is under acceptable level of control since starting [name] in January 2013.    Review of Systems     Objective:   Physical Exam        Assessment & Plan:  Following medication review, no suggestions for change.  Complete medication list provided to patient.  Total time in face to face medication review: 15 minutes.  Patient seen with:  Albertine Grates, Pharmacy Resident

## 2012-01-16 NOTE — Progress Notes (Signed)
  Subjective:    Patient ID: Michele Carter, female    DOB: 1961/02/17, 51 y.o.   MRN: [id number]  HPI Reviewed and agree with Dr. Macky Lower management.    Review of Systems     Objective:   Physical Exam        Assessment & Plan:

## 2012-02-20 ENCOUNTER — Other Ambulatory Visit: Payer: Self-pay | Admitting: Obstetrics and Gynecology

## 2012-02-20 DIAGNOSIS — Z1231 Encounter for screening mammogram for malignant neoplasm of breast: Secondary | ICD-10-CM

## 2012-03-21 ENCOUNTER — Ambulatory Visit
Admission: RE | Admit: 2012-03-21 | Discharge: 2012-03-21 | Disposition: A | Discharge status: Home / Self Care | Payer: 59 | Source: Ambulatory Visit | Attending: Obstetrics and Gynecology | Admitting: Obstetrics and Gynecology

## 2012-03-21 DIAGNOSIS — Z1231 Encounter for screening mammogram for malignant neoplasm of breast: Secondary | ICD-10-CM

## 2012-07-09 ENCOUNTER — Other Ambulatory Visit: Payer: Self-pay | Admitting: Family Medicine

## 2012-10-14 ENCOUNTER — Other Ambulatory Visit: Payer: Self-pay | Admitting: Family Medicine

## 2012-10-28 ENCOUNTER — Encounter: Payer: Self-pay | Admitting: Family Medicine

## 2012-10-28 ENCOUNTER — Ambulatory Visit (INDEPENDENT_AMBULATORY_CARE_PROVIDER_SITE_OTHER): Payer: 59 | Admitting: Family Medicine

## 2012-10-28 VITALS — BP 130/90 | HR 85 | Temp 98.2°F | Ht 65.0 in | Wt 185.0 lb

## 2012-10-28 DIAGNOSIS — M542 Cervicalgia: Secondary | ICD-10-CM

## 2012-10-28 NOTE — Patient Instructions (Addendum)
Follow up with your chiropractor  Cal me or Dr Leveda Anna if you would like to try medications or need a refill on the pain medication - give [place] the name and strength  If you have weakness or are not better in 6 weeks then call us

## 2012-10-28 NOTE — Assessment & Plan Note (Signed)
New onset.  Sounds to be musculoskeletal  Although could be mild nerve impingement.  No signs of central cord compression or fracture.  She has a Chiropodist and based on her 2011 cspine films I think it would be fine for her to have manipulation.  If she desires medications she will contact us.

## 2012-10-28 NOTE — Progress Notes (Signed)
  Subjective:    Patient ID: Michele Carter, female    DOB: 11-24-1961, 51 y.o.   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  Had Cspine xray 2011 with degenerative joint disease but no instability  Review of Systems     Objective:   Physical Exam No acute distress Neck - decreased range of motion in all quadrants worse with gaze to left with pain in left poster neck shoulder. Spurlings negative  No bony tenderness Able to walk on heels and toes normal upper extrem strength       Assessment & Plan:

## 2012-11-11 ENCOUNTER — Other Ambulatory Visit: Payer: Self-pay | Admitting: Family Medicine

## 2012-12-06 ENCOUNTER — Encounter: Payer: Self-pay | Admitting: Family Medicine

## 2012-12-06 ENCOUNTER — Ambulatory Visit (INDEPENDENT_AMBULATORY_CARE_PROVIDER_SITE_OTHER): Payer: 59 | Admitting: Family Medicine

## 2012-12-06 VITALS — BP 136/74 | HR 70 | Temp 98.1°F | Wt 186.7 lb

## 2012-12-06 DIAGNOSIS — M542 Cervicalgia: Secondary | ICD-10-CM

## 2012-12-06 NOTE — Progress Notes (Signed)
  Subjective:    Patient ID: Michele Carter, female    DOB: 07/02/1961, 51 y.o.   MRN: [id number]  HPI SDA for right posterior shoulder pain x 6 months.  Now with possible lump.  No trauma, unusual activity.  Pain is mild - here to check no serious problem  Has had shingles of face before.  On enbrel so at higher risk.  Review of Systems     Objective:   Physical Exam No mass.  Does have slight redness to skin in area of pain.  1/2 dollar sized.  No true skin hypersensitivity.   No thoracic back pain.        Assessment & Plan:

## 2012-12-06 NOTE — Assessment & Plan Note (Signed)
This seems to be a separate problem from chronic neck pain.  Pain sounds neuropathic.  Best guess is previous mild shingles with post herpetic neuralgia. Per patient, pain is not worth adding a med at this point.

## 2012-12-06 NOTE — Patient Instructions (Signed)
Likely this is old shingles (post herpetic neuralgia) Let me know if it gets worse of if it really seems you have a lump - I would do more testing. You might want to try capsicum roll on for this and for your neck pain.  Play around with it.  It is an interesting cream

## 2013-01-09 ENCOUNTER — Ambulatory Visit: Payer: 59 | Admitting: Pharmacist

## 2013-01-17 ENCOUNTER — Other Ambulatory Visit: Payer: Self-pay | Admitting: Family Medicine

## 2013-01-17 DIAGNOSIS — K2100 Gastro-esophageal reflux disease with esophagitis, without bleeding: Secondary | ICD-10-CM

## 2013-01-17 MED ORDER — PANTOPRAZOLE SODIUM 40 MG PO TBEC: 40.0000 mg | DELAYED_RELEASE_TABLET | Freq: Every day | ORAL | Status: DC

## 2013-01-17 NOTE — Assessment & Plan Note (Signed)
Will switch nexium to protonix based on formulary change

## 2013-01-24 ENCOUNTER — Encounter: Payer: Self-pay | Admitting: Pharmacist

## 2013-01-24 ENCOUNTER — Ambulatory Visit (INDEPENDENT_AMBULATORY_CARE_PROVIDER_SITE_OTHER): Payer: 59 | Admitting: Pharmacist

## 2013-01-24 VITALS — BP 116/80 | HR 85 | Ht 65.0 in | Wt 186.1 lb

## 2013-01-24 DIAGNOSIS — L4050 Arthropathic psoriasis, unspecified: Secondary | ICD-10-CM

## 2013-01-24 NOTE — Progress Notes (Signed)
Patient ID: Michele Carter, female   DOB: 01-Mar-1961, 52 y.o.   MRN: [id number] Reviewed: Agree with Dr. Macky Lower documentation and management.

## 2013-01-24 NOTE — Patient Instructions (Addendum)
Thanks for coming in today!

## 2013-01-24 NOTE — Progress Notes (Signed)
  Subjective:    Patient ID: Michele Carter, female    DOB: 27-Mar-1961, 52 y.o.   MRN: [id number]  HPI  Patient arrives in good spirits for medication review. Reports seeing Dr. Leveda Anna as primary care provider, Dr. Zenovia Jordan [facility]) as rheumatologist, Dr. Cherly Hensen [facility]). Reports being recently diagnosed with Psoriatic Arthritis in October 2012 and states she is under acceptable level of control since starting [name] in January 2013   Review of Systems     Objective:   Physical Exam        Assessment & Plan:  Tolerating [name] very well and states it has provided excellent pain control.  She is attempting a trial OFF of meloxicam and changing to only PRN use.  Following medication review, no suggestions for change.  Complete medication list provided to patient.  Total time in face to face medication review: 15 minutes.

## 2013-01-24 NOTE — Assessment & Plan Note (Signed)
Tolerating [name] very well and states it has provided excellent pain control.  She is attempting a trial OFF of meloxicam and changing to only PRN use.  Following medication review, no suggestions for change.  Complete medication list provided to patient.  Total time in face to face medication review: 15 minutes.

## 2013-02-11 ENCOUNTER — Other Ambulatory Visit: Payer: Self-pay | Admitting: Obstetrics and Gynecology

## 2013-02-11 DIAGNOSIS — Z1231 Encounter for screening mammogram for malignant neoplasm of breast: Secondary | ICD-10-CM

## 2013-03-17 ENCOUNTER — Other Ambulatory Visit: Payer: Self-pay | Admitting: Dermatology

## 2013-03-25 ENCOUNTER — Ambulatory Visit
Admission: RE | Admit: 2013-03-25 | Discharge: 2013-03-25 | Disposition: A | Discharge status: Home / Self Care | Payer: 59 | Source: Ambulatory Visit | Attending: Obstetrics and Gynecology | Admitting: Obstetrics and Gynecology

## 2013-03-25 DIAGNOSIS — Z1231 Encounter for screening mammogram for malignant neoplasm of breast: Secondary | ICD-10-CM

## 2013-05-01 ENCOUNTER — Encounter: Payer: Self-pay | Admitting: Family Medicine

## 2013-05-19 ENCOUNTER — Encounter: Payer: Self-pay | Admitting: Family Medicine

## 2013-05-20 ENCOUNTER — Encounter: Payer: Self-pay | Admitting: Family Medicine

## 2013-05-20 DIAGNOSIS — E119 Type 2 diabetes mellitus without complications: Secondary | ICD-10-CM | POA: Insufficient documentation

## 2013-05-20 DIAGNOSIS — R7303 Prediabetes: Secondary | ICD-10-CM

## 2013-05-20 NOTE — Progress Notes (Signed)
Patient ID: Michele Carter, female   DOB: 1961-09-17, 52 y.o.   MRN: [id number] Outside labs done 05/01/13 at GYN office. Normal CBC CMP normal except Calcium =10.5 Chol LDL=103 Nl TSH Abnormal A1C=6.3 Also Tdap was given

## 2013-05-26 ENCOUNTER — Encounter: Payer: Self-pay | Admitting: Family Medicine

## 2013-06-05 ENCOUNTER — Encounter: Payer: Self-pay | Admitting: *Deleted

## 2013-06-05 ENCOUNTER — Other Ambulatory Visit: Payer: Self-pay | Admitting: Family Medicine

## 2013-06-05 ENCOUNTER — Encounter: Payer: 59 | Attending: Obstetrics and Gynecology | Admitting: *Deleted

## 2013-06-05 VITALS — Ht 66.0 in | Wt 184.3 lb

## 2013-06-05 DIAGNOSIS — E119 Type 2 diabetes mellitus without complications: Secondary | ICD-10-CM | POA: Insufficient documentation

## 2013-06-05 DIAGNOSIS — R7303 Prediabetes: Secondary | ICD-10-CM

## 2013-06-05 DIAGNOSIS — Z713 Dietary counseling and surveillance: Secondary | ICD-10-CM | POA: Insufficient documentation

## 2013-06-05 NOTE — Patient Instructions (Addendum)
Goals:  Follow Diabetes Meal Plan as instructed  Eat 3 meals and 2 snacks, every 3-5 hrs  Limit carbohydrate intake to 30-45 grams carbohydrate/meal  Limit carbohydrate intake to 30 grams carbohydrate/snack  Add lean protein foods to meals/snacks  Monitor glucose levels as instructed by your doctor  Aim for 30 mins of physical activity daily  Bring food record and glucose log to your next nutrition visit   

## 2013-06-05 NOTE — Progress Notes (Signed)
  Patient was seen on 06/06/2013 for the first of a series of three diabetes self-management courses at [facility]. The following learning objectives were met by the patient during this course:   Defines the role of glucose and insulin  Identifies type of diabetes and pathophysiology  Defines the diagnostic criteria for diabetes and prediabetes  States the risk factors for Type 2 Diabetes  States the symptoms of Type 2 Diabetes  Defines Type 2 Diabetes treatment goals  Defines Type 2 Diabetes treatment options  States the rationale for glucose monitoring  Identifies A1C, glucose targets, and testing times  Identifies proper sharps disposal  Defines the purpose of a diabetes food plan  Identifies carbohydrate food groups  Defines effects of carbohydrate foods on glucose levels  Identifies carbohydrate choices/grams/food labels  States benefits of physical activity and effect on glucose  Review of suggested activity guidelines  Handouts given during class include:  Type 2 Diabetes: Basics Book  My Food Plan Book  Food and Activity Log  Follow-Up Plan: Attend core class 2 and 3.

## 2013-06-13 ENCOUNTER — Encounter: Payer: 59 | Admitting: *Deleted

## 2013-06-13 ENCOUNTER — Telehealth: Payer: Self-pay | Admitting: Family Medicine

## 2013-06-13 DIAGNOSIS — R7303 Prediabetes: Secondary | ICD-10-CM

## 2013-06-13 DIAGNOSIS — E119 Type 2 diabetes mellitus without complications: Secondary | ICD-10-CM

## 2013-06-13 NOTE — Progress Notes (Signed)
  Patient was seen on 06/13/13 for the second of a series of three diabetes self-management courses at the [facility]. The following learning objectives were met by the patient during this course:   Explain basic nutrition maintenance and quality assurance  Describe causes, symptoms and treatment of hypoglycemia and hyperglycemia  Explain how to manage diabetes during illness  Describe the importance of good nutrition for health and healthy eating strategies  List strategies to follow meal plan when dining out  Describe the effects of alcohol on glucose and how to use it safely  Describe problem solving skills for day-to-day glucose challenges  Describe strategies to use when treatment plan needs to change  Identify important factors involved in successful weight loss  Describe ways to remain physically active  Describe the impact of regular activity on insulin resistance   Handouts given in class:  Refrigerator magnet for Sick Day Guidelines  [facility] Oral medication/insulin handout  Follow-Up Plan: Patient will attend the final class of the ADA Diabetes Self-Care Education.   

## 2013-06-13 NOTE — Assessment & Plan Note (Signed)
Called to [facility] test strips, lancets and alcohol swabs.

## 2013-06-13 NOTE — Telephone Encounter (Signed)
TrueResult test strips, lancets, and ETOH swabs sent to [facility] per [facility] Care Management Team, Gwenyth Ober, Care Management Coordinator, phone number [phone], by PCP.  Mykela Mewborn, Darlyne Russian, CMA

## 2013-07-05 ENCOUNTER — Ambulatory Visit: Payer: 59

## 2013-07-08 ENCOUNTER — Encounter: Payer: 59 | Attending: Obstetrics and Gynecology

## 2013-07-08 DIAGNOSIS — E119 Type 2 diabetes mellitus without complications: Secondary | ICD-10-CM | POA: Insufficient documentation

## 2013-07-08 DIAGNOSIS — Z713 Dietary counseling and surveillance: Secondary | ICD-10-CM | POA: Insufficient documentation

## 2013-07-09 NOTE — Progress Notes (Signed)
Patient was seen on 07/08/13 for the third of a series of three diabetes self-management courses at the [facility]. The following learning objectives were met by the patient during this course:    Describe how diabetes changes over time   Identify diabetes complications and ways to prevent them   Describe strategies that can promote heart health including lowering blood pressure and cholesterol   Describe strategies to lower dietary fat and sodium in the diet   Identify physical activities that benefit cardiovascular health   Describe role of stress on blood glucose and develop strategies to address psychosocial issues   Evaluate success in meeting personal goal   Describe the belief that they can live successfully with diabetes day to day   Establish 2-3 goals that they will plan to diligently work on until they return for the free 50-month follow-up visit  The following handouts were given in class:  Goal setting handout  Class evaluation form  Low-sodium seasoning tips  Stress management handout  Your patient has established the following 4 month goals for diabetes self-care:  Count carbohydrates at most meals and snacks  Increase my physical activity at least 4 days/week  Your patient has identified these potential barriers to change:  Staying focused  stress  Your patient has identified their diabetes self-care support plan as:  husband   Follow-Up Plan: Patient was offered a 4 month follow-up visit for diabetes self-management education.

## 2013-07-16 ENCOUNTER — Other Ambulatory Visit: Payer: Self-pay | Admitting: Family Medicine

## 2013-07-30 ENCOUNTER — Ambulatory Visit: Payer: 59 | Admitting: Family Medicine

## 2013-08-22 ENCOUNTER — Encounter: Payer: Self-pay | Admitting: Family Medicine

## 2013-08-22 ENCOUNTER — Ambulatory Visit (INDEPENDENT_AMBULATORY_CARE_PROVIDER_SITE_OTHER): Payer: 59 | Admitting: Family Medicine

## 2013-08-22 VITALS — BP 115/66 | HR 75 | Temp 98.7°F | Ht 66.0 in | Wt 179.1 lb

## 2013-08-22 DIAGNOSIS — K2100 Gastro-esophageal reflux disease with esophagitis, without bleeding: Secondary | ICD-10-CM

## 2013-08-22 DIAGNOSIS — Z23 Encounter for immunization: Secondary | ICD-10-CM

## 2013-08-22 DIAGNOSIS — E119 Type 2 diabetes mellitus without complications: Secondary | ICD-10-CM

## 2013-08-22 DIAGNOSIS — E785 Hyperlipidemia, unspecified: Secondary | ICD-10-CM

## 2013-08-22 LAB — POCT GLYCOSYLATED HEMOGLOBIN (HGB A1C): Hemoglobin A1C: 5.7

## 2013-08-22 MED ORDER — PNEUMOCOCCAL VAC POLYVALENT 25 MCG/0.5ML IJ INJ: 0.5000 mL | INJECTION | INTRAMUSCULAR | Status: DC

## 2013-08-22 MED ORDER — FAMOTIDINE 40 MG PO TABS: 40.0000 mg | ORAL_TABLET | Freq: Every day | ORAL | Status: DC

## 2013-08-22 NOTE — Assessment & Plan Note (Signed)
Good control on diet.  Motivated and losing weight.

## 2013-08-22 NOTE — Patient Instructions (Addendum)
Your A1C and your urine check for early diabetic kidney disease will be in my chart. You received a pneumonia vaccine today.   I sent in a new prescription for famotidine which takes the place of your protonix.  Let me know if it works for your reflux.  Great job on weight loss.

## 2013-08-22 NOTE — Assessment & Plan Note (Signed)
Labs from GYN 6/3 show Tri=118, HDL=41, LDL=103

## 2013-08-22 NOTE — Assessment & Plan Note (Signed)
Try switch to H2 blocker to decrease risk of osteoporosis.

## 2013-08-22 NOTE — Progress Notes (Signed)
  Subjective:    Patient ID: Michele Carter, female    DOB: July 02, 1961, 52 y.o.   MRN: [id number]  HPI FU medical problems.  She sees Ob for her annual physicals.  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   Doing well. Excellent response to embrel.   Found diabetes education classes very helpful.   Knows she needs annual eye exam.  Also learned she needs pneumovax since diabetic. Not on ACE so will need urine microalbumin. Blood work at [facility] Normal CBC, CMP and Cholesterol - see problem list for values.    Review of Systems     Objective:   Physical Exam Lungs clear Cardiac RRR without m or g Abd benign. Diabetic foot exam normal pulses and sensation.       Assessment & Plan:

## 2013-08-23 LAB — MICROALBUMIN / CREATININE URINE RATIO
Creatinine, Urine: 117.1 mg/dL
Microalb, Ur: 0.6 mg/dL (ref 0.00–1.89)

## 2013-08-25 ENCOUNTER — Encounter: Payer: Self-pay | Admitting: Family Medicine

## 2013-09-09 ENCOUNTER — Encounter: Payer: Self-pay | Admitting: Family Medicine

## 2013-09-09 ENCOUNTER — Ambulatory Visit (INDEPENDENT_AMBULATORY_CARE_PROVIDER_SITE_OTHER): Payer: 59 | Admitting: Family Medicine

## 2013-09-09 VITALS — BP 116/75 | HR 71 | Temp 98.8°F | Ht 66.0 in | Wt 180.0 lb

## 2013-09-09 DIAGNOSIS — B029 Zoster without complications: Secondary | ICD-10-CM

## 2013-09-09 DIAGNOSIS — Z23 Encounter for immunization: Secondary | ICD-10-CM

## 2013-09-09 MED ORDER — VALACYCLOVIR HCL 1 G PO TABS: 1000.0000 mg | ORAL_TABLET | Freq: Three times a day (TID) | ORAL | Status: DC

## 2013-09-09 NOTE — Patient Instructions (Addendum)
I'm going to send in the Valtrex for you.  If you start this, take for 7 days total.    Let me know if you're still having issues.   Flu shot today.  It was good to meet you today!

## 2013-09-09 NOTE — Progress Notes (Signed)
Subjective:    Michele Carter is a 52 y.o. female who presents to [facility] today for concern for shingles:  1.  Shingles:  Hx/o of this in past x 2.  Originally in 2009.  Occurred in V1 trigeminal region Left side of face.  DId not affect her eye.  Recurrence was several months ago across back.  No treatment required at this time.  Awoke this AM with tingling Left forehead, very senstive when putting on mascara.  Saw "white bump" on forehead and wanted to be checked out to ensure not shingles outbreak.  On biologic medication (enbrel) and therefore has never had shingles vaccines.  No changes in vision.    The following portions of the patient's history were reviewed and updated as appropriate: allergies, current medications, past medical history, family and social history, and problem list. Patient is a nonsmoker.    PMH reviewed.  Past Medical History  Diagnosis Date  . IBS (irritable bowel syndrome)   . Hyperlipidemia   . Acid reflux    Past Surgical History  Procedure Laterality Date  . Cholecystectomy    . Tubal ligation      Medications reviewed. Current Outpatient Prescriptions  Medication Sig Dispense Refill  . aspirin 81 MG tablet Take 81 mg by mouth daily.        . Calcium Carbonate-Vitamin D 600-400 MG-UNIT per tablet Take 1 tablet by mouth daily.      . cyclobenzaprine (FLEXERIL) 5 MG tablet One by mouth at bedtime as needed neck muscle spasm.       . etanercept ([name]) 50 MG/ML injection Inject 0.98 mLs (50 mg total) into the skin once a week.      . famotidine (PEPCID) 40 MG tablet Take 1 tablet (40 mg total) by mouth daily.  90 tablet  3  . fenofibrate micronized (LOFIBRA) 134 MG capsule TAKE 1 CAPSULE BY MOUTH DAILY WITH A MEAL  90 capsule  3  . meloxicam (MOBIC) 15 MG tablet Take 1 tablet (15 mg total) by mouth daily.      . Multiple Vitamins-Minerals (MULTIVITAMIN) tablet Take 1 tablet by mouth daily.      . Omega-3 Fatty Acids (FISH OIL) 1000 MG CAPS Take 1 capsule  by mouth daily.        . Polyethylene Glycol 3350 LIQD 17 grams by mouth once daily PRN.  Dispense QS for one month supply      . simvastatin (ZOCOR) 10 MG tablet TAKE ONE TABLET BY MOUTH AT BEDTIME  90 tablet  PRN   No current facility-administered medications for this visit.    ROS as above otherwise neg.  No chest pain, palpitations, SOB, Fever, Chills, Abd pain, N/V/D.   Objective:   Physical Exam There were no vitals taken for this visit. Gen:  Alert, cooperative patient who appears stated age in no acute distress.  Vital signs reviewed. HEENT: EOMI, PERRL.  No conjunctival injection.  MMM Skin:  Prior shingles scar noted Left medial aspect of forehead.  Small white comedone which could possibly be vesicle noted just left of midline of forehead.  No other vesicles/papules noted. Minimally tender to palpation throughout this area.  No temporal artery pain.    No results found for this or any previous visit (from the past 72 hour(s)).

## 2013-09-10 DIAGNOSIS — B029 Zoster without complications: Secondary | ICD-10-CM | POA: Insufficient documentation

## 2013-09-10 NOTE — Assessment & Plan Note (Signed)
Not high concern for current outbreak, but only present for about 2-3 hours this AM and has already had 2 previous outbreaks. Will provide Valtrex to take at home if she notes worsening paresthesias/pain/vesicles.  If vesicles do appear, recommended she return so we can ensure no eye involvement. Currently doing well, she wants to try work today.

## 2013-09-17 ENCOUNTER — Encounter: Payer: Self-pay | Admitting: Family Medicine

## 2013-10-10 ENCOUNTER — Encounter: Payer: Self-pay | Admitting: Family Medicine

## 2013-10-14 NOTE — Telephone Encounter (Signed)
Pt is asking for direction as she addressed the problem in the email Please advise

## 2013-10-17 ENCOUNTER — Encounter: Payer: Self-pay | Admitting: Family Medicine

## 2013-10-17 ENCOUNTER — Ambulatory Visit (INDEPENDENT_AMBULATORY_CARE_PROVIDER_SITE_OTHER): Payer: 59 | Admitting: Family Medicine

## 2013-10-17 VITALS — BP 128/74 | HR 75 | Temp 98.8°F | Ht 66.0 in | Wt 177.0 lb

## 2013-10-17 DIAGNOSIS — F4323 Adjustment disorder with mixed anxiety and depressed mood: Secondary | ICD-10-CM

## 2013-10-17 MED ORDER — CITALOPRAM HYDROBROMIDE 20 MG PO TABS: 20.0000 mg | ORAL_TABLET | Freq: Every day | ORAL | Status: DC

## 2013-10-17 NOTE — Assessment & Plan Note (Signed)
She is in counseling at church.  Will try moderate dose SSRI.

## 2013-10-17 NOTE — Progress Notes (Signed)
  Subjective:    Patient ID: Michele Carter, female    DOB: 1961-10-05, 52 y.o.   MRN: [id number]  HPI Depression - more of an adjustment reaction.  Relates to stresses and difficult personality matches at work. No hx of depression.  Never on antidepressants.  Has mixed anxiety component in that she feels under the microscope.  Sleeping OK.  No SI or HI.  Just found out that her 66 yo daughter is pregnant.  Shanda Bumps is single, so she has some mixed feelings but mostly positive.  Shanda Bumps is happy about the pregnancy.    Review of Systems     Objective:   Physical Exam Teary but fully rational throughout.       Assessment & Plan:

## 2013-10-17 NOTE — Patient Instructions (Signed)
Congrats gramma I hope this medication works well for you. Call me if worse or if you are having side effects. See me in one month.

## 2013-11-04 ENCOUNTER — Encounter: Payer: 59 | Attending: Obstetrics and Gynecology | Admitting: *Deleted

## 2013-11-04 VITALS — Ht 66.0 in | Wt 176.2 lb

## 2013-11-04 DIAGNOSIS — E119 Type 2 diabetes mellitus without complications: Secondary | ICD-10-CM | POA: Insufficient documentation

## 2013-11-04 DIAGNOSIS — Z713 Dietary counseling and surveillance: Secondary | ICD-10-CM | POA: Insufficient documentation

## 2013-11-04 NOTE — Progress Notes (Signed)
  Patient was seen on 11/04/13 for their 3 month follow-up as a part of the diabetes self-management courses at the [facility]. The following learning objectives were met by your patient during this course:  Patient self reports the following: A1c dropped from 6.3 to 5.7%, she is also very happy with weight loss of 8 pounds since starting classes  Diabetes control has improved since diabetes self-management training: YES Number of days blood glucose is >200: NONE Last MD appointment for diabetes: August 19, 2013 Changes in treatment plan: CHECK BG LESS OFTEN AS LONG AS BGS WITHIN TARGET RANGES Confidence with ability to manage diabetes: GREAT Areas for improvement with diabetes self-care: JUST CONTINUE WITH CURRENT MEAL PLAN Willingness to participate in diabetes support group: NOT AT THIS TIME  Please see Diabetes Flow sheet for findings related to patient's self-care.  Your patient has established the following 4 month goals for diabetes self-care:  Count carbohydrates at most meals and snacks DOING WELL Increase my physical activity at least 4 days/week - DID MORE IN THE SUMMER, STILL 2 AEROBIC CLASSES AT WORK Your patient has identified these potential barriers to change:  Staying focused  stress Your patient has identified their diabetes self-care support plan as:  Husband HAS BEEN VERY SUPPORTIVE  Follow-Up Plan:  Follow up with [facility] as needed .

## 2013-11-19 ENCOUNTER — Encounter: Payer: Self-pay | Admitting: Family Medicine

## 2013-11-19 MED ORDER — VALACYCLOVIR HCL 1 G PO TABS: 1000.0000 mg | ORAL_TABLET | Freq: Three times a day (TID) | ORAL | Status: DC

## 2013-11-19 MED ORDER — GABAPENTIN 100 MG PO CAPS
100.0000 mg | ORAL_CAPSULE | Freq: Three times a day (TID) | ORAL | Status: DC
Start: 2013-11-19 — End: 2015-02-22

## 2013-11-19 NOTE — Telephone Encounter (Signed)
See reply to patient.

## 2013-12-04 ENCOUNTER — Other Ambulatory Visit: Payer: Self-pay | Admitting: *Deleted

## 2013-12-04 LAB — LIPID PANEL
Cholesterol, Total: 168
Triglycerides: 118

## 2013-12-04 NOTE — Progress Notes (Signed)
Entered in error. Michele Carter, Michele Carter

## 2013-12-29 ENCOUNTER — Telehealth: Payer: Self-pay | Admitting: Family Medicine

## 2013-12-29 MED ORDER — TRIAMCINOLONE ACETONIDE 0.1 % EX OINT: 1.0000 "application " | TOPICAL_OINTMENT | Freq: Two times a day (BID) | CUTANEOUS | Status: DC

## 2013-12-29 NOTE — Telephone Encounter (Signed)
Refill request for triamcinolone cream. Patient states that the rash on her pinky has returned and the cream helped last time. Anderson.

## 2014-01-13 ENCOUNTER — Other Ambulatory Visit: Payer: Self-pay | Admitting: Family Medicine

## 2014-01-13 DIAGNOSIS — K2100 Gastro-esophageal reflux disease with esophagitis, without bleeding: Secondary | ICD-10-CM

## 2014-01-13 NOTE — Assessment & Plan Note (Signed)
Refill per e request 

## 2014-02-12 ENCOUNTER — Other Ambulatory Visit: Payer: Self-pay | Admitting: Family Medicine

## 2014-02-13 ENCOUNTER — Encounter: Payer: Self-pay | Admitting: Family Medicine

## 2014-02-13 ENCOUNTER — Ambulatory Visit (INDEPENDENT_AMBULATORY_CARE_PROVIDER_SITE_OTHER): Payer: 59 | Admitting: Family Medicine

## 2014-02-13 VITALS — BP 119/84 | HR 71 | Temp 98.0°F | Ht 66.0 in | Wt 174.0 lb

## 2014-02-13 DIAGNOSIS — R1310 Dysphagia, unspecified: Secondary | ICD-10-CM | POA: Insufficient documentation

## 2014-02-13 DIAGNOSIS — E119 Type 2 diabetes mellitus without complications: Secondary | ICD-10-CM

## 2014-02-13 DIAGNOSIS — R42 Dizziness and giddiness: Secondary | ICD-10-CM

## 2014-02-13 LAB — POCT GLYCOSYLATED HEMOGLOBIN (HGB A1C): Hemoglobin A1C: 5.7

## 2014-02-13 NOTE — Assessment & Plan Note (Addendum)
Sounds oropharyngial.  Mild.  Start Workup with modified barium swallow.  May need ENT referral for nasopharyngoscopy.  Very low on my Diff Dx is brain stem problem (dysphagia and vertigo.)  If other neuro sx, will need MRI.

## 2014-02-13 NOTE — Assessment & Plan Note (Signed)
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  Some elements suggest BPPV.  She will observe symptoms and read up on BPPV.  If she thinks she fits, will refer to PT for otolith repositioning.

## 2014-02-13 NOTE — Patient Instructions (Signed)
I will call with the swallow test results Over the counter meclizine is fine for your vertigo. Look up benign paroxsymal vertigo.  If you change your mind and think your symptoms fit, call me and I will refer you to physical therapy to go through them maneuvers.   Also, if you develop other symptoms that might be related to your brain, let me know and I will work up further.

## 2014-02-13 NOTE — Assessment & Plan Note (Signed)
Good control on diet alone.  Nice, slow wt loss.

## 2014-02-13 NOTE — Progress Notes (Signed)
   Subjective:    Patient ID: Michele Carter, female    DOB: [birth year], 53 y.o.   MRN: [id number]  HPIDiann is doing well and looking forward to her first grandchild.  Two issues: Has episodes of vertigo.  Typically brief (one day) and severe.  This one has been mild and lasted three days.  Relieved by OTC meclizine.  No hearing loss.  Completely fine between episodes.  Changing head position this time makes it worse.  No solid food dysphagia.  No sense of food getting stuck in mid chest.  Difficulty swallowing, slowly progressive over past two years.  Does occassionally choke on food.  Points to upper neck when describing choking.  Occasional cough.  Does not seem temporally related to #1    Review of Systems     Objective:   Physical Exam Normal HEENT  Normal neck CN 2-12 intact       Assessment & Plan:

## 2014-02-17 ENCOUNTER — Telehealth: Payer: Self-pay | Admitting: Family Medicine

## 2014-02-17 ENCOUNTER — Other Ambulatory Visit: Payer: Self-pay

## 2014-02-17 DIAGNOSIS — Z1231 Encounter for screening mammogram for malignant neoplasm of breast: Secondary | ICD-10-CM

## 2014-02-17 NOTE — Telephone Encounter (Signed)
Pt called about swallowing test referral Please advise

## 2014-02-19 ENCOUNTER — Other Ambulatory Visit (HOSPITAL_COMMUNITY): Payer: Self-pay | Admitting: Family Medicine

## 2014-02-19 ENCOUNTER — Encounter: Payer: Self-pay | Admitting: Family Medicine

## 2014-02-19 DIAGNOSIS — R1310 Dysphagia, unspecified: Secondary | ICD-10-CM

## 2014-02-19 NOTE — Telephone Encounter (Signed)
Spoke with patient about her appointment 10am 3/10 @ [facility]

## 2014-02-20 ENCOUNTER — Encounter: Payer: Self-pay | Admitting: Family Medicine

## 2014-02-20 NOTE — Telephone Encounter (Signed)
Patient requesting information about barium swallow.she sees to appointment on [facility].I  Informed her that both appointment are in same facility and that this test is in two parts that's the reasoning why it shows 2 appointments. She voiced understanding.Channah Godeaux, Lewie Loron

## 2014-02-24 ENCOUNTER — Ambulatory Visit (HOSPITAL_COMMUNITY)
Admission: RE | Admit: 2014-02-24 | Discharge: 2014-02-24 | Disposition: A | Discharge status: Home / Self Care | Payer: 59 | Source: Ambulatory Visit | Attending: Family Medicine | Admitting: Family Medicine

## 2014-02-24 ENCOUNTER — Encounter (HOSPITAL_COMMUNITY): Payer: 59

## 2014-02-24 DIAGNOSIS — R1310 Dysphagia, unspecified: Secondary | ICD-10-CM | POA: Insufficient documentation

## 2014-02-24 DIAGNOSIS — K219 Gastro-esophageal reflux disease without esophagitis: Secondary | ICD-10-CM | POA: Insufficient documentation

## 2014-02-24 NOTE — Procedures (Signed)
Objective Swallowing Evaluation: Modified Barium Swallowing Study  Patient Details  Name: Michele Carter MRN: [id number] Date of Birth: 1961-01-14  Today's Date: 02/24/2014 Time: 1130-1200 SLP Time Calculation (min): 30 min  Past Medical History:  Past Medical History  Diagnosis Date  . IBS (irritable bowel syndrome)   . Hyperlipidemia   . Acid reflux    Past Surgical History:  Past Surgical History  Procedure Laterality Date  . Cholecystectomy    . Tubal ligation     HPI:  This 53 y.o. female was referred for OP MBS due to frequent episodes of choking on food and occassionally liquids.  PMH: GERD, (on meds and followed by Dr. Fuller Plan); psoriatic arthritis.     Assessment / Plan / Recommendation Clinical Impression  Dysphagia Diagnosis: Within Functional Limits Clinical impression: Patient exhibits normal oropharyngeal swallow function on this exam, however, pt. reports her symptoms are sproadic and quite frightening when they occur.  Pt. reports her "throat closes up and food will not go down."  Pt. frequently has to bring the food back up into her mouth.  Question if pt. has a tight UES they intermittently does not relax.  Consider f/u with either ENT or GI for possible dilitation vs. botox if deemed appropriate.    Treatment Recommendation  No treatment recommended at this time    Diet Recommendation Regular;Thin liquid   Liquid Administration via: Cup;Straw Medication Administration: Whole meds with liquid Supervision: Patient able to self feed Compensations: Slow rate;Small sips/bites;Follow solids with liquid Postural Changes and/or Swallow Maneuvers: Seated upright 90 degrees;Upright 30-60 min after meal    Other  Recommendations Recommended Consults: Consider ENT evaluation;Consider GI evaluation Oral Care Recommendations: Oral care BID Other Recommendations: Clarify dietary restrictions   Follow Up Recommendations  None    Frequency and Duration         Pertinent Vitals/Pain n/a    SLP Swallow Goals     General HPI: This 53 y.o. female was referred for OP MBS due to frequent episodes of choking on food and occassionally liquids.  PMH: GERD, (on meds and followed by Dr. Fuller Plan); psoriatic arthritis. Type of Study: Modified Barium Swallowing Study Reason for Referral: Objectively evaluate swallowing function Previous Swallow Assessment: none Diet Prior to this Study: Regular;Thin liquids Temperature Spikes Noted: No Respiratory Status: Room air History of Recent Intubation: No Behavior/Cognition: Alert;Cooperative;Pleasant mood Oral Cavity - Dentition: Adequate natural dentition Oral Motor / Sensory Function: Within functional limits Self-Feeding Abilities: Able to feed self Patient Positioning: Upright in chair Baseline Vocal Quality: Clear Volitional Cough: Strong Volitional Swallow: Able to elicit Anatomy: Within functional limits Pharyngeal Secretions: Not observed secondary MBS    Reason for Referral Objectively evaluate swallowing function   Oral Phase Oral Preparation/Oral Phase Oral Phase: WFL   Pharyngeal Phase Pharyngeal Phase Pharyngeal Phase: Within functional limits  Cervical Esophageal Phase    GO    Cervical Esophageal Phase Cervical Esophageal Phase: [facility]    Functional Limitations: Swallowing Swallow Current Status (Y5638): 0 percent impaired, limited or restricted Swallow Goal Status (L3734): 0 percent impaired, limited or restricted Swallow Discharge Status (K8768): 0 percent impaired, limited or restricted    Quinn Axe T 02/24/2014, 1:22 PM

## 2014-03-03 ENCOUNTER — Telehealth: Payer: Self-pay | Admitting: Family Medicine

## 2014-03-03 ENCOUNTER — Encounter: Payer: Self-pay | Admitting: Family Medicine

## 2014-03-03 ENCOUNTER — Ambulatory Visit (INDEPENDENT_AMBULATORY_CARE_PROVIDER_SITE_OTHER): Payer: 59 | Admitting: Family Medicine

## 2014-03-03 VITALS — BP 121/75 | HR 75 | Temp 98.1°F | Ht 66.0 in | Wt 176.0 lb

## 2014-03-03 DIAGNOSIS — H6590 Unspecified nonsuppurative otitis media, unspecified ear: Secondary | ICD-10-CM

## 2014-03-03 DIAGNOSIS — J329 Chronic sinusitis, unspecified: Secondary | ICD-10-CM

## 2014-03-03 MED ORDER — FLUTICASONE PROPIONATE 50 MCG/ACT NA SUSP: 2.0000 | Freq: Every day | NASAL | Status: DC

## 2014-03-03 MED ORDER — GUAIFENESIN 200 MG PO TABS: 200.0000 mg | ORAL_TABLET | ORAL | Status: DC | PRN

## 2014-03-03 MED ORDER — BENZONATATE 100 MG PO CAPS: 100.0000 mg | ORAL_CAPSULE | Freq: Two times a day (BID) | ORAL | Status: DC | PRN

## 2014-03-03 MED ORDER — AZITHROMYCIN 500 MG PO TABS: 500.0000 mg | ORAL_TABLET | Freq: Every day | ORAL | Status: DC

## 2014-03-03 NOTE — Telephone Encounter (Signed)
Pt has been coughing for 3 days, especially at night, sore throat,  Mucinex DM not working. No fever Could something be called in for the cough? Please advise

## 2014-03-03 NOTE — Patient Instructions (Addendum)
It has been a pleasure to see you today.  It seem that you have an upper respiratory infection. Only use antibiotic prescribed if your symptoms do not improve, as we discussed.  Take the rest of medication as prescribed.  Follow up as needed

## 2014-03-03 NOTE — Assessment & Plan Note (Signed)
Does not appear infected. Chronic vs acute.  Flonase  and symptomatic treatment for cough and URI( guaifenesin and tessalon pearls) Since pt is immunocompromised due to medication she is on for psoriasis, we discussed the use for antibiotic coverage in this case. Azithromycin was prescribed to be used only if signs of bacterial sinusitis/otitis develop in the next days.  F/u as needed

## 2014-03-03 NOTE — Telephone Encounter (Signed)
Patient schedule to be seen today@ 10:30 with Dr Piloto/crosscover.Michele Carter, Michele Carter

## 2014-03-03 NOTE — Progress Notes (Signed)
Family Medicine Office Visit Note   Subjective:   Patient ID: Michele Carter, female  DOB: [birth year], 53 y.o.. MRN: [id number]   Pt that comes today or same day appointment complaining of URI symptoms started last Wednesday. She reports having sore throat congestion, postnasal dripping and night time dry cough. She denies fever, chills or other systemic symptoms.  Pt takes etarnecept weekly for psoriasis.   Review of Systems:  Pt denies sinus pain, nausea , vomiting, SOB, chest pain, headaches, dizziness, numbness or weakness. No changes on urinary or BM habits.   Objective:   Physical Exam: Gen:  NAD HEENT: Moist mucous membranes. No tenderness to palpation of frontal or maxillary sinuses. Ears: Bilateral ear effusion, no erythema or bulging of TM's. Normal ear canals.  Throat: erythema without exudates. No adenopathies.  CV: Regular rate and rhythm, no murmurs rubs or gallops PULM: Clear to auscultation bilaterally. No wheezes/rales/rhonchi EXT: No edema Neuro: Alert and oriented x3. No focalization  Assessment & Plan:

## 2014-03-23 ENCOUNTER — Encounter: Payer: Self-pay | Admitting: Emergency Medicine

## 2014-03-23 ENCOUNTER — Ambulatory Visit (INDEPENDENT_AMBULATORY_CARE_PROVIDER_SITE_OTHER): Payer: 59 | Admitting: Emergency Medicine

## 2014-03-23 VITALS — BP 116/73 | HR 70 | Ht 66.0 in | Wt 180.0 lb

## 2014-03-23 DIAGNOSIS — B029 Zoster without complications: Secondary | ICD-10-CM

## 2014-03-23 MED ORDER — VALACYCLOVIR HCL 1 G PO TABS: 1000.0000 mg | ORAL_TABLET | Freq: Three times a day (TID) | ORAL | Status: DC

## 2014-03-23 NOTE — Patient Instructions (Signed)
It was nice to meet you!  I think you're right about the shingles. I resent the Valtrex for you. Take it for 1 week.  If you start having eye symptoms, see [place] or your eye doctor right away. Avoid children and babies until the rash is gone. Work on stress management to prevent future flares.  Follow up as needed.

## 2014-03-23 NOTE — Assessment & Plan Note (Signed)
Recurrent left forehead, no eye symptoms. Several acute stressors and on immuno-suppression for psoriasis. Treat with Valtrex x1 week. Return precautions as in AVS. F/u as needed.

## 2014-03-23 NOTE — Progress Notes (Signed)
Subjective:    Patient ID: Michele Carter, female    DOB: 02/21/1961, 53 y.o.   MRN: [id number]  HPI Michele Carter is here for a SDA for shingles.  She states that she had shingles previously in 2009 on her left forehead.  She states that on Thursday, she noticed some pimples on her left forehead that were similar to the lesions from 2009.  Also with some stinging pain in the left forehead.  No eye swelling, redness, pain or vision changes.  She had some leftover Valtrex that she started taking on Friday.  Sister recently diagnosed with uterine cancer and starting treatment; daughter pregnant as stressors. On enbrel for psoriatic arthritis.  Current Outpatient Prescriptions on File Prior to Visit  Medication Sig Dispense Refill  . aspirin 81 MG tablet Take 81 mg by mouth daily.        Marland Kitchen azithromycin (ZITHROMAX) 500 MG tablet Take 1 tablet (500 mg total) by mouth daily.  3 tablet  0  . benzonatate (TESSALON) 100 MG capsule Take 1 capsule (100 mg total) by mouth 2 (two) times daily as needed for cough.  20 capsule  0  . Calcium Carbonate-Vitamin D 600-400 MG-UNIT per tablet Take 1 tablet by mouth daily.      . citalopram (CELEXA) 20 MG tablet Take 1 tablet (20 mg total) by mouth daily.  30 tablet  3  . cyclobenzaprine (FLEXERIL) 5 MG tablet One by mouth at bedtime as needed neck muscle spasm.       . etanercept ([name]) 50 MG/ML injection Inject 0.98 mLs (50 mg total) into the skin once a week.      . famotidine (PEPCID) 40 MG tablet Take 1 tablet (40 mg total) by mouth daily.  90 tablet  3  . fenofibrate micronized (LOFIBRA) 134 MG capsule TAKE 1 CAPSULE BY MOUTH DAILY WITH A MEAL  90 capsule  3  . fluticasone (FLONASE) 50 MCG/ACT nasal spray Place 2 sprays into both nostrils daily.  16 g  6  . gabapentin (NEURONTIN) 100 MG capsule Take 1 capsule (100 mg total) by mouth 3 (three) times daily. For nerve pain  90 capsule  3  . guaiFENesin 200 MG tablet Take 1 tablet (200 mg total) by mouth  every 4 (four) hours as needed for cough or to loosen phlegm.  30 suppository  0  . meloxicam (MOBIC) 15 MG tablet Take 1 tablet (15 mg total) by mouth daily.      . Multiple Vitamins-Minerals (MULTIVITAMIN) tablet Take 1 tablet by mouth daily.      . Omega-3 Fatty Acids (FISH OIL) 1000 MG CAPS Take 1 capsule by mouth daily.        . pantoprazole (PROTONIX) 40 MG tablet TAKE 1 TABLET BY MOUTH DAILY.  90 tablet  3  . Polyethylene Glycol 3350 LIQD 17 grams by mouth once daily PRN.  Dispense QS for one month supply      . simvastatin (ZOCOR) 10 MG tablet TAKE 1 TABLET BY MOUTH AT BEDTIME  90 tablet  3  . triamcinolone ointment (KENALOG) 0.1 % Apply 1 application topically 2 (two) times daily.  30 g  3   No current facility-administered medications on file prior to visit.    I have reviewed and updated the following as appropriate: allergies and current medications SHx: non smoker   Review of Systems See HPI    Objective:   Physical Exam BP 116/73  Pulse 70  Ht 5'  6" (1.676 m)  Wt 180 lb (81.647 kg)  BMI 29.07 kg/m2 Gen: alert, cooperative, NAD Skin: 2-3 small papules around left eyebrow Eyes: no redness or swelling     Assessment & Plan:

## 2014-03-26 ENCOUNTER — Ambulatory Visit
Admission: RE | Admit: 2014-03-26 | Discharge: 2014-03-26 | Disposition: A | Discharge status: Home / Self Care | Payer: 59 | Source: Ambulatory Visit

## 2014-03-26 DIAGNOSIS — Z1231 Encounter for screening mammogram for malignant neoplasm of breast: Secondary | ICD-10-CM

## 2014-03-30 ENCOUNTER — Encounter: Payer: Self-pay | Admitting: Family Medicine

## 2014-03-31 ENCOUNTER — Telehealth: Payer: Self-pay | Admitting: *Deleted

## 2014-03-31 ENCOUNTER — Other Ambulatory Visit: Payer: Self-pay | Admitting: *Deleted

## 2014-03-31 DIAGNOSIS — F4323 Adjustment disorder with mixed anxiety and depressed mood: Secondary | ICD-10-CM

## 2014-03-31 MED ORDER — CITALOPRAM HYDROBROMIDE 20 MG PO TABS: 20.0000 mg | ORAL_TABLET | Freq: Every day | ORAL | Status: DC

## 2014-03-31 NOTE — Telephone Encounter (Signed)
Patient was recently dx'd with shingles.  Has one more dose left of med.  Was planning to go to baby shower on Saturday and wants to know if that is ok.  Area is on forehead and are not open lesions--"looks like a fine heat rash."  Discussed with Dr. Neal--instructed patient to finish med.  Ok to attend baby shower as long as she doesn't  Have any open lesions.  Burna Forts, BSN, RN-BC

## 2014-03-31 NOTE — Telephone Encounter (Signed)
Requesting 90 day supply.  Derl Barrow, RN

## 2014-04-06 ENCOUNTER — Encounter: Payer: Self-pay | Admitting: Family Medicine

## 2014-04-06 ENCOUNTER — Ambulatory Visit (INDEPENDENT_AMBULATORY_CARE_PROVIDER_SITE_OTHER): Payer: 59 | Admitting: Family Medicine

## 2014-04-06 VITALS — BP 124/82 | HR 62 | Ht 66.0 in | Wt 177.0 lb

## 2014-04-06 DIAGNOSIS — R21 Rash and other nonspecific skin eruption: Secondary | ICD-10-CM

## 2014-04-06 MED ORDER — KETOCONAZOLE 2 % EX CREA: 1.0000 "application " | TOPICAL_CREAM | Freq: Two times a day (BID) | CUTANEOUS | Status: DC

## 2014-04-06 NOTE — Patient Instructions (Signed)
Rash - it is unclear what is causing the rash at this time, suspect seborrheic dermatitis, attempt trial of Ketoconazole cream twice daily for 4 weeks, return in 2 weeks to monitor progress

## 2014-04-07 NOTE — Progress Notes (Signed)
   Subjective:    Patient ID: Michele Carter, female    DOB: [birth year], 53 y.o.   MRN: [id number]  HPI 53 year old female presents for evaluation of rash on her face. Patient was recently evaluated in early April 2015 and diagnosed with herpes zoster. She is provided valacyclovir which provided minimal relief of her symptoms, patient reports pimples and erythema over bilateral forehead and nose, she does have a history of herpes zoster over the left for head, patient also has a history of psoriasis and psoriatic arthritis that is well-controlled with [name], she states that her skin is mildly pruritic in this area, she does wear makeup on a daily basis however has attempted to decrease the amount that she applies, she denies any new changes in shampoos or perfumes, she reports some mild left ye "pressure" this past week, she was evaluated by an ophthalmologist who states that her eyes were within normal limits   Review of Systems  Constitutional: Negative for fever, chills and fatigue.  Respiratory: Positive for choking. Negative for shortness of breath.   Cardiovascular: Negative for chest pain.  Skin: Positive for color change and rash.       Objective:   Physical Exam Vitals: Reviewed General: Pleasant Caucasian female, no acute distress HEENT: Normocephalic, pupils are equal round and reactive to light, extra amounts are intact, no scleral icterus, nasal septum midline, moist mucous members, no pharyngeal erythema or exudate noted, neck was supple, no anterior posterior cervical lymphadenopathy, no supraclavicular lymphadenopathy Skin: Erythematous macular papular rash present over the forehead and nose, small amount of scaling present, no vesicles present     Assessment & Plan:  Please see problem specific assessment and plan.

## 2014-04-07 NOTE — Assessment & Plan Note (Signed)
Maculopapular erythematous rash with mild scaling over the forehead and nose. Differential includes seborrheic dermatitis versus allergic reaction. Rash is not consistent with herpes zoster as previously diagnosed. -Will attempt trial of ketoconazole cream twice daily -Patient counseled to use makeup sparingly

## 2014-04-27 ENCOUNTER — Ambulatory Visit: Payer: 59 | Admitting: Family Medicine

## 2014-04-29 ENCOUNTER — Ambulatory Visit (INDEPENDENT_AMBULATORY_CARE_PROVIDER_SITE_OTHER): Payer: 59 | Admitting: Family Medicine

## 2014-04-29 ENCOUNTER — Encounter: Payer: Self-pay | Admitting: Family Medicine

## 2014-04-29 VITALS — BP 100/60 | HR 70 | Temp 98.0°F | Ht 66.0 in | Wt 178.0 lb

## 2014-04-29 DIAGNOSIS — M797 Fibromyalgia: Secondary | ICD-10-CM

## 2014-04-29 DIAGNOSIS — IMO0001 Reserved for inherently not codable concepts without codable children: Secondary | ICD-10-CM

## 2014-04-29 NOTE — Assessment & Plan Note (Signed)
Her problem does not perfectly match any syndrome - fibromyalgia is the closest fit.  The shoulder girdle distribution and trigger points fit.  The primary hyperasthesia/dysasthesia and relative lack of pain is a poor fit.  The neuropathic type symptoms might also fit a variant of complex regional pain syndrome.  Regardless of diagnosis, the current treatment program is going well.  We discussed options for changes should symptoms increase (increase gabapentin and/or change celexa to cymbalta).  Will leave meds unchanged for now since symptoms are well managed.

## 2014-04-29 NOTE — Progress Notes (Signed)
   Subjective:    Patient ID: ORIYA KETTERING, female    DOB: 05-Apr-1961, 53 y.o.   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  She wants a recheck of her upper back discomfort.  It is a mixed story.  She complains of intermitant skin hypersensitivity in migrating locations, bilateral, always upper back.  Lowest area is at bra line, Highest area is at shoulders.  Some pain.  Also has hot sensations.  She has previously had zoster but never and outbreak in that area.  I gave her gabapentin last visit and it works nicely.  She wants to know if further testing necessary.  She also has known C spine disease but this does not sound like radiculopathy.    Review of Systems     Objective:   Physical Exam  No pain or dysasthesias at present.  I could elicit two trigger points.  No arm weakness.  Posture is fair.  Weak rhomboids bilaterally.        Assessment & Plan:

## 2014-04-29 NOTE — Patient Instructions (Signed)
You should google fibromyalgia - that is not an exact fit for you but it is the closest fit.  Also google complex regional pain syndrome to learn more. Remember to do the exercises that I showed you.   There are lots of other treatments we can use if the current meds stop working.

## 2014-06-25 ENCOUNTER — Encounter: Payer: Self-pay | Admitting: Family Medicine

## 2014-06-25 DIAGNOSIS — E785 Hyperlipidemia, unspecified: Secondary | ICD-10-CM

## 2014-06-25 NOTE — Progress Notes (Signed)
Patient ID: Michele Carter, female   DOB: [birth year], 53 y.o.   MRN: [id number] Outside labs faxed from [facility].  CMP, lipids, TSH and CBC.  Important abnormalities are LDL= 110 (HDL=46) and Ca++=11.5  She will make an appointment to see me.  Needs work up of hypercalcemia and likely change statin.

## 2014-06-26 ENCOUNTER — Encounter: Payer: Self-pay | Admitting: Family Medicine

## 2014-06-26 ENCOUNTER — Ambulatory Visit (INDEPENDENT_AMBULATORY_CARE_PROVIDER_SITE_OTHER): Payer: 59 | Admitting: Family Medicine

## 2014-06-26 VITALS — BP 116/53 | HR 72 | Temp 98.0°F | Wt 179.5 lb

## 2014-06-26 DIAGNOSIS — E119 Type 2 diabetes mellitus without complications: Secondary | ICD-10-CM

## 2014-06-26 LAB — POCT GLYCOSYLATED HEMOGLOBIN (HGB A1C): Hemoglobin A1C: 5.9

## 2014-06-26 NOTE — Assessment & Plan Note (Signed)
Great control on diet.

## 2014-06-26 NOTE — Progress Notes (Signed)
   Subjective:    Patient ID: Michele Carter, female    DOB: 1961-09-20, 53 y.o.   MRN: [id number]  HPI  Seen by Ob who has been doing labs and annual preventive exam.  Found to have increased calcium of 11.5.  Denies bone pain.  Up to date on cancer screening. Does take supplemental Calcium    Review of Systems     Objective:   Physical Exam  Neck supple, no masses.        Assessment & Plan:

## 2014-06-26 NOTE — Assessment & Plan Note (Signed)
Check calcium and PTH.  Further workup based on those results.

## 2014-06-26 NOTE — Patient Instructions (Signed)
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   We should be able to do next steps all by phone.

## 2014-06-29 LAB — PTH, INTACT AND CALCIUM
CALCIUM: 10.1 mg/dL (ref 8.4–10.5)
PTH: 31 pg/mL (ref 14.0–72.0)

## 2014-07-08 ENCOUNTER — Telehealth: Payer: Self-pay | Admitting: Gastroenterology

## 2014-07-08 NOTE — Telephone Encounter (Signed)
Patient with intermittent bright red rectal bleeding for 1 month.  She will come in next week with Alonza Bogus, PA 07/15/14.  She is offered an appt for today, but she declined.

## 2014-07-10 ENCOUNTER — Encounter: Payer: Self-pay | Admitting: *Deleted

## 2014-07-13 ENCOUNTER — Other Ambulatory Visit: Payer: Self-pay | Admitting: Family Medicine

## 2014-07-15 ENCOUNTER — Encounter: Payer: Self-pay | Admitting: Gastroenterology

## 2014-07-15 ENCOUNTER — Ambulatory Visit (INDEPENDENT_AMBULATORY_CARE_PROVIDER_SITE_OTHER): Payer: 59 | Admitting: Gastroenterology

## 2014-07-15 VITALS — BP 104/68 | HR 68 | Ht 66.0 in | Wt 180.8 lb

## 2014-07-15 DIAGNOSIS — K648 Other hemorrhoids: Secondary | ICD-10-CM | POA: Insufficient documentation

## 2014-07-15 DIAGNOSIS — K649 Unspecified hemorrhoids: Secondary | ICD-10-CM

## 2014-07-15 DIAGNOSIS — K5900 Constipation, unspecified: Secondary | ICD-10-CM | POA: Insufficient documentation

## 2014-07-15 DIAGNOSIS — K625 Hemorrhage of anus and rectum: Secondary | ICD-10-CM

## 2014-07-15 MED ORDER — POLYETHYLENE GLYCOL 3350 17 GM/SCOOP PO POWD: 1.0000 | Freq: Every day | ORAL | Status: DC

## 2014-07-15 MED ORDER — HYDROCORTISONE ACETATE 25 MG RE SUPP: 25.0000 mg | Freq: Two times a day (BID) | RECTAL | Status: DC

## 2014-07-15 NOTE — Progress Notes (Signed)
07/15/2014 Michele Carter [id number] Aug 22, 1961   HISTORY OF PRESENT ILLNESS:  This is a pleasant 53 year old female who is known to Dr. Fuller Plan for previous colonoscopies.  Her last colonoscopy was in 09/2010 at which time she was found to have only internal and external hemorrhoids.  She presents to our office today with complaints of constipation.  Says that this issue has been ongoing for a while.  She uses Miralax prn, which works well, but she does not take it regularly.  Also, for the past month she has been seeing bright red blood with bowel movements; no blood noted between stools.  Some discomfort when passing stool as well, but no pain.  She had a normal BM today with some blood.  Denies abdominal pain or weight loss.   Past Medical History  Diagnosis Date  . IBS (irritable bowel syndrome)   . Hyperlipidemia   . Acid reflux    Past Surgical History  Procedure Laterality Date  . Cholecystectomy    . Tubal ligation    . Colonoscopy  10/05/2010    Normal--Multiple   . Esophagogastroduodenoscopy  01/04/2005    reports that she has never smoked. She has never used smokeless tobacco. She reports that she does not drink alcohol or use illicit drugs. family history includes COPD in her other; Diabetes in her other; Hypertension in her other; Uterine cancer in her sister. Allergies  Allergen Reactions  . Demerol [Meperidine]   . Penicillins Other (See Comments)    REACTION: during childhood, unknown reaction  . Meperidine Hcl Other (See Comments)    REACTION: severe GI upset      Outpatient Encounter Prescriptions as of 07/15/2014  Medication Sig  . aspirin 81 MG tablet Take 81 mg by mouth daily.    . Calcium Carbonate-Vitamin D 600-400 MG-UNIT per tablet Take 1 tablet by mouth daily.  . citalopram (CELEXA) 20 MG tablet Take 1 tablet (20 mg total) by mouth daily.  . cyclobenzaprine (FLEXERIL) 5 MG tablet One by mouth at bedtime as needed neck muscle spasm.   . etanercept  ([name]) 50 MG/ML injection Inject 0.98 mLs (50 mg total) into the skin once a week.  . fenofibrate micronized (LOFIBRA) 134 MG capsule TAKE 1 CAPSULE BY MOUTH DAILY WITH A MEAL  . fluticasone (FLONASE) 50 MCG/ACT nasal spray Place 2 sprays into both nostrils daily.  Marland Kitchen gabapentin (NEURONTIN) 100 MG capsule Take 1 capsule (100 mg total) by mouth 3 (three) times daily. For nerve pain  . guaiFENesin 200 MG tablet Take 1 tablet (200 mg total) by mouth every 4 (four) hours as needed for cough or to loosen phlegm.  . meloxicam (MOBIC) 15 MG tablet Take 1 tablet (15 mg total) by mouth daily.  . Multiple Vitamins-Minerals (MULTIVITAMIN) tablet Take 1 tablet by mouth daily.  . Omega-3 Fatty Acids (FISH OIL) 1000 MG CAPS Take 1 capsule by mouth daily.    . pantoprazole (PROTONIX) 40 MG tablet TAKE 1 TABLET BY MOUTH DAILY.  . valACYclovir (VALTREX) 1000 MG tablet Take 1 tablet (1,000 mg total) by mouth 3 (three) times daily. X 7 days  . [DISCONTINUED] Polyethylene Glycol 3350 LIQD 17 grams by mouth once daily PRN.  Dispense QS for one month supply  . [DISCONTINUED] triamcinolone ointment (KENALOG) 0.1 % Apply 1 application topically 2 (two) times daily.  . hydrocortisone (ANUSOL-HC) 25 MG suppository Place 1 suppository (25 mg total) rectally 2 (two) times daily.  . polyethylene glycol powder (  GLYCOLAX/MIRALAX) powder Take 255 g (1 Container total) by mouth daily.  . [DISCONTINUED] famotidine (PEPCID) 40 MG tablet Take 1 tablet (40 mg total) by mouth daily.  . [DISCONTINUED] ketoconazole (NIZORAL) 2 % cream Apply 1 application topically 2 (two) times daily.  . [DISCONTINUED] simvastatin (ZOCOR) 10 MG tablet TAKE 1 TABLET BY MOUTH AT BEDTIME     REVIEW OF SYSTEMS  : All other systems reviewed and negative except where noted in the History of Present Illness.   PHYSICAL EXAM: BP 104/68  Pulse 68  Ht 5\' 6"  (1.676 m)  Wt 180 lb 12.8 oz (82.01 kg)  BMI 29.20 kg/m2 General: Well developed white female in  no acute distress Head: Normocephalic and atraumatic Eyes:  Sclerae anicteric, conjunctiva pink. Ears: Normal auditory acuity Lungs: Clear throughout to auscultation Heart: Regular rate and rhythm Abdomen: Soft, non-distended.  Normal bowel sounds.  Non-tender. Rectal:  Hemorrhoids noted, one just inside the anus with irritation and small blood spot suggesting recent bleeding.  No masses or tenderness noted on DRE.  Light brown stool noted on exam glove. Musculoskeletal: Symmetrical with no gross deformities  Skin: No lesions on visible extremities Extremities: No edema  Neurological: Alert oriented x 4, grossly non-focal Psychological:  Alert and cooperative. Normal mood and affect  ASSESSMENT AND PLAN: -Constipation:  Improved when she takes Miralax.  Needs to take it more regularly, either daily or every other day. -Rectal bleeding:  Exam reveals hemorrhoids with stigmata of recent bleeding.  Will use hydrocortisone suppositories BID for 7-10 days.  Rectal care instructions.  Needs to keep stools soft.

## 2014-07-15 NOTE — Progress Notes (Signed)
Reviewed and agree with management plan.  Malcolm T. Stark, MD FACG 

## 2014-07-15 NOTE — Patient Instructions (Addendum)
We have sent the following medications to your pharmacy for you to pick up at your convenience: Anusol  Miralax   Please follow up as needed

## 2014-08-18 ENCOUNTER — Encounter: Payer: Self-pay | Admitting: Family Medicine

## 2014-08-18 DIAGNOSIS — R1310 Dysphagia, unspecified: Secondary | ICD-10-CM

## 2014-08-19 NOTE — Telephone Encounter (Signed)
ENT referral

## 2014-08-19 NOTE — Assessment & Plan Note (Signed)
Ordered ENT referral for persistent symptoms.

## 2014-08-20 ENCOUNTER — Telehealth: Payer: Self-pay | Admitting: *Deleted

## 2014-08-20 NOTE — Telephone Encounter (Signed)
Spoke with patient and informed her of her appointment on Friday 04/24/4834 with Dr. Erik Obey at [facility] [address] phone number [phone]. Patient informed if she needs to cancel or reschedule to call them. I faxed over information to them

## 2014-08-20 NOTE — Telephone Encounter (Signed)
Patient calling asking about referral to ENT, would like this appt set up soon due to her schedule. Will forward to white team.

## 2014-08-21 ENCOUNTER — Other Ambulatory Visit: Payer: Self-pay | Admitting: Otolaryngology

## 2014-08-21 DIAGNOSIS — R1310 Dysphagia, unspecified: Secondary | ICD-10-CM

## 2014-08-27 ENCOUNTER — Ambulatory Visit (HOSPITAL_COMMUNITY)
Admission: RE | Admit: 2014-08-27 | Discharge: 2014-08-27 | Disposition: A | Discharge status: Home / Self Care | Payer: 59 | Source: Ambulatory Visit | Attending: Otolaryngology | Admitting: Otolaryngology

## 2014-08-27 ENCOUNTER — Other Ambulatory Visit: Payer: 59

## 2014-08-27 DIAGNOSIS — R4789 Other speech disturbances: Secondary | ICD-10-CM | POA: Insufficient documentation

## 2014-08-27 DIAGNOSIS — R1310 Dysphagia, unspecified: Secondary | ICD-10-CM | POA: Insufficient documentation

## 2014-08-27 DIAGNOSIS — K449 Diaphragmatic hernia without obstruction or gangrene: Secondary | ICD-10-CM | POA: Diagnosis not present

## 2014-09-25 ENCOUNTER — Other Ambulatory Visit: Payer: Self-pay | Admitting: Family Medicine

## 2014-10-02 ENCOUNTER — Encounter: Payer: Self-pay | Admitting: Family Medicine

## 2014-10-02 ENCOUNTER — Other Ambulatory Visit: Payer: Self-pay

## 2014-12-16 ENCOUNTER — Encounter: Payer: Self-pay | Admitting: Family Medicine

## 2014-12-16 NOTE — Telephone Encounter (Signed)
(  Reponse to pt Email) Called pt to make sure she was not having any chest pain or SOB and she stated she was not, informed her if either of those symptoms started that she needed to go to the ED.  Also informed her that if she felt like she needed to be seen before next week that she could go to one of the urgent care facilities. Pt agreeable to plan. Katharina Caper, April D

## 2014-12-22 ENCOUNTER — Encounter: Payer: Self-pay | Admitting: Family Medicine

## 2015-01-01 ENCOUNTER — Telehealth: Payer: Self-pay | Admitting: Family Medicine

## 2015-01-01 ENCOUNTER — Emergency Department (HOSPITAL_COMMUNITY): Payer: 59

## 2015-01-01 ENCOUNTER — Emergency Department (HOSPITAL_COMMUNITY)
Admission: EM | Admit: 2015-01-01 | Discharge: 2015-01-01 | Disposition: A | Discharge status: Home / Self Care | Payer: 59 | Attending: Emergency Medicine | Admitting: Emergency Medicine

## 2015-01-01 ENCOUNTER — Encounter (HOSPITAL_COMMUNITY): Payer: Self-pay | Admitting: *Deleted

## 2015-01-01 DIAGNOSIS — Z8673 Personal history of transient ischemic attack (TIA), and cerebral infarction without residual deficits: Secondary | ICD-10-CM | POA: Diagnosis not present

## 2015-01-01 DIAGNOSIS — Z79899 Other long term (current) drug therapy: Secondary | ICD-10-CM | POA: Diagnosis not present

## 2015-01-01 DIAGNOSIS — Z7951 Long term (current) use of inhaled steroids: Secondary | ICD-10-CM | POA: Insufficient documentation

## 2015-01-01 DIAGNOSIS — Z791 Long term (current) use of non-steroidal anti-inflammatories (NSAID): Secondary | ICD-10-CM | POA: Diagnosis not present

## 2015-01-01 DIAGNOSIS — K219 Gastro-esophageal reflux disease without esophagitis: Secondary | ICD-10-CM | POA: Insufficient documentation

## 2015-01-01 DIAGNOSIS — R079 Chest pain, unspecified: Secondary | ICD-10-CM

## 2015-01-01 DIAGNOSIS — L4050 Arthropathic psoriasis, unspecified: Secondary | ICD-10-CM | POA: Diagnosis not present

## 2015-01-01 DIAGNOSIS — Z7982 Long term (current) use of aspirin: Secondary | ICD-10-CM | POA: Insufficient documentation

## 2015-01-01 DIAGNOSIS — Z7952 Long term (current) use of systemic steroids: Secondary | ICD-10-CM | POA: Insufficient documentation

## 2015-01-01 DIAGNOSIS — R072 Precordial pain: Secondary | ICD-10-CM | POA: Diagnosis not present

## 2015-01-01 DIAGNOSIS — Z880 Allergy status to penicillin: Secondary | ICD-10-CM | POA: Diagnosis not present

## 2015-01-01 DIAGNOSIS — E785 Hyperlipidemia, unspecified: Secondary | ICD-10-CM | POA: Diagnosis not present

## 2015-01-01 HISTORY — DX: Arthropathic psoriasis, unspecified: L40.50

## 2015-01-01 HISTORY — DX: Transient cerebral ischemic attack, unspecified: G45.9

## 2015-01-01 LAB — CBC
HCT: 40.4 % (ref 36.0–46.0)
Hemoglobin: 13.6 g/dL (ref 12.0–15.0)
MCH: 29.7 pg (ref 26.0–34.0)
MCHC: 33.7 g/dL (ref 30.0–36.0)
MCV: 88.2 fL (ref 78.0–100.0)
Platelets: 297 10*3/uL (ref 150–400)
RBC: 4.58 MIL/uL (ref 3.87–5.11)
RDW: 12.6 % (ref 11.5–15.5)
WBC: 7.7 10*3/uL (ref 4.0–10.5)

## 2015-01-01 LAB — BASIC METABOLIC PANEL
Anion gap: 11 (ref 5–15)
BUN: 10 mg/dL (ref 6–23)
CALCIUM: 10 mg/dL (ref 8.4–10.5)
CO2: 27 mmol/L (ref 19–32)
Chloride: 105 mEq/L (ref 96–112)
Creatinine, Ser: 0.74 mg/dL (ref 0.50–1.10)
GFR calc Af Amer: >90 mL/min (ref >90)
Glucose, Bld: 145 mg/dL — ABNORMAL HIGH (ref 70–99)
Potassium: 4.1 mmol/L (ref 3.5–5.1)
SODIUM: 143 mmol/L (ref 135–145)

## 2015-01-01 LAB — TROPONIN I

## 2015-01-01 MED ORDER — ASPIRIN 81 MG PO CHEW
324.0000 mg | CHEWABLE_TABLET | Freq: Once | ORAL | Status: AC
  Administered 2015-01-01: 324 mg via ORAL
  Filled 2015-01-01: qty 4

## 2015-01-01 MED ORDER — NITROGLYCERIN 0.4 MG SL SUBL
0.4000 mg | SUBLINGUAL_TABLET | SUBLINGUAL | Status: DC | PRN
  Administered 2015-01-01: 0.4 mg via SUBLINGUAL
  Filled 2015-01-01: qty 1

## 2015-01-01 MED ORDER — ACETAMINOPHEN 500 MG PO TABS
1000.0000 mg | ORAL_TABLET | Freq: Once | ORAL | Status: AC
  Administered 2015-01-01: 1000 mg via ORAL
  Filled 2015-01-01: qty 2

## 2015-01-01 NOTE — Telephone Encounter (Signed)
Pt would like DR. Hensel to call her. She is at the ED now . jw

## 2015-01-01 NOTE — Telephone Encounter (Signed)
Pt called and wanted Dr. Andria Frames to know that she is in the ED now for her anxiety. Please call when you can . jw

## 2015-01-01 NOTE — ED Notes (Signed)
The pt is c/o  Mid-chest pain for one week intermittently .  One hour ago while she was here at work the chest pain started again she has also had some bi-lateral jaw tightness intermittently.   She reports that she has been under a lot of stress in the past weeks. No  Cardiac history lmp none

## 2015-01-01 NOTE — ED Provider Notes (Signed)
CSN: [id number]     Arrival date & time 01/01/15  1513 History   First MD Initiated Contact with Patient 01/01/15 1748     Chief Complaint  Patient presents with  . Chest Pain     (Consider location/radiation/quality/duration/timing/severity/associated sxs/prior Treatment) Patient is a 54 y.o. female presenting with chest pain. The history is provided by the patient.  Chest Pain Pain location:  Substernal area Pain quality: aching   Pain radiates to:  Does not radiate Pain radiates to the back: no   Pain severity:  Mild Onset quality:  Gradual Timing:  Intermittent Progression:  Worsening Chronicity:  Recurrent Context: stress (her boss brings on the pain)   Relieved by:  Rest Associated symptoms: no claudication, no cough, no fatigue, no fever and no shortness of breath     Past Medical History  Diagnosis Date  . IBS (irritable bowel syndrome)   . Hyperlipidemia   . Acid reflux   . TIA (transient ischemic attack) 1995  . Psoriatic arthritis    Past Surgical History  Procedure Laterality Date  . Cholecystectomy    . Tubal ligation    . Colonoscopy  10/05/2010    Normal--Multiple   . Esophagogastroduodenoscopy  01/04/2005   Family History  Problem Relation Age of Onset  . COPD Other   . Hypertension Other   . Diabetes Other   . Uterine cancer Sister    History  Substance Use Topics  . Smoking status: Never Smoker   . Smokeless tobacco: Never Used  . Alcohol Use: No   OB History    No data available     Review of Systems  Constitutional: Negative for fever and fatigue.  Respiratory: Negative for cough and shortness of breath.   Cardiovascular: Positive for chest pain. Negative for claudication.  All other systems reviewed and are negative.     Allergies  Demerol; Penicillins; and Meperidine hcl  Home Medications   Prior to Admission medications   Medication Sig Start Date End Date Taking? Authorizing Provider  aspirin 81 MG tablet Take 81 mg by  mouth daily.      Historical Provider, MD  Calcium Carbonate-Vitamin D 600-400 MG-UNIT per tablet Take 1 tablet by mouth daily. 01/16/12   Zigmund Gottron, MD  citalopram (CELEXA) 20 MG tablet TAKE 1 TABLET BY MOUTH DAILY 09/28/14   Zigmund Gottron, MD  cyclobenzaprine (FLEXERIL) 5 MG tablet One by mouth at bedtime as needed neck muscle spasm.     Historical Provider, MD  etanercept ([name]) 50 MG/ML injection Inject 0.98 mLs (50 mg total) into the skin once a week. 01/16/12   Zigmund Gottron, MD  fenofibrate micronized (LOFIBRA) 134 MG capsule TAKE 1 CAPSULE BY MOUTH DAILY WITH A MEAL 07/13/14   Zigmund Gottron, MD  fluticasone [facility]) 50 MCG/ACT nasal spray Place 2 sprays into both nostrils daily. 03/03/14   Dayarmys Piloto de Gwendalyn Ege, MD  gabapentin (NEURONTIN) 100 MG capsule Take 1 capsule (100 mg total) by mouth 3 (three) times daily. For nerve pain 11/19/13   Zigmund Gottron, MD  guaiFENesin 200 MG tablet Take 1 tablet (200 mg total) by mouth every 4 (four) hours as needed for cough or to loosen phlegm. 03/03/14   Dayarmys Piloto de Gwendalyn Ege, MD  hydrocortisone (ANUSOL-HC) 25 MG suppository Place 1 suppository (25 mg total) rectally 2 (two) times daily. 07/15/14   Janett Billow D. Zehr, PA-C  meloxicam (MOBIC) 15 MG tablet Take 1 tablet (15 mg total)  by mouth daily. 01/16/12   Zigmund Gottron, MD  Multiple Vitamins-Minerals (MULTIVITAMIN) tablet Take 1 tablet by mouth daily. 01/16/12   Zigmund Gottron, MD  Omega-3 Fatty Acids (FISH OIL) 1000 MG CAPS Take 1 capsule by mouth daily.      Historical Provider, MD  pantoprazole (PROTONIX) 40 MG tablet TAKE 1 TABLET BY MOUTH DAILY. 01/13/14   Zigmund Gottron, MD  polyethylene glycol powder (GLYCOLAX/MIRALAX) powder Take 255 g (1 Container total) by mouth daily. 07/15/14   Janett Billow D. Zehr, PA-C  valACYclovir (VALTREX) 1000 MG tablet Take 1 tablet (1,000 mg total) by mouth 3 (three) times daily. X 7 days 03/23/14   Melony Overly, MD   BP 124/71 mmHg  Pulse 66  Temp(Src) 98.2 F (36.8 C) (Oral)  Resp 23  SpO2 97% Physical Exam  Constitutional: She is oriented to person, place, and time. She appears well-developed and well-nourished. No distress.  HENT:  Head: Normocephalic and atraumatic.  Mouth/Throat: Oropharynx is clear and moist.  Eyes: EOM are normal. Pupils are equal, round, and reactive to light.  Neck: Normal range of motion. Neck supple.  Cardiovascular: Normal rate and regular rhythm.  Exam reveals no friction rub.   No murmur heard. Pulmonary/Chest: Effort normal and breath sounds normal. No respiratory distress. She has no wheezes. She has no rales.  Abdominal: Soft. She exhibits no distension. There is no tenderness. There is no rebound.  Musculoskeletal: Normal range of motion. She exhibits no edema.  Neurological: She is alert and oriented to person, place, and time.  Skin: No rash noted. She is not diaphoretic.  Nursing note and vitals reviewed.   ED Course  Procedures (including critical care time) Labs Review Labs Reviewed  BASIC METABOLIC PANEL - Abnormal; Notable for the following:    Glucose, Bld 145 (*)    All other components within normal limits  CBC  TROPONIN I  TROPONIN I    Imaging Review Dg Chest 2 View  01/01/2015   CLINICAL DATA:  One week history of chest pain and shortness of breath. Nonsmoker.  EXAM: CHEST  2 VIEW  COMPARISON:  07/21/2011.  FINDINGS: Cardiomediastinal silhouette unremarkable, unchanged. Lungs clear. Bronchovascular markings normal. Pulmonary vascularity normal. No visible pleural effusions. No pneumothorax. Mild degenerative changes involving the thoracic spine. No significant interval change.  IMPRESSION: No acute cardiopulmonary disease.  Stable examination.   Electronically Signed   By: Evangeline Dakin M.D.   On: 01/01/2015 16:33     EKG Interpretation   Date/Time:  Friday January 01 2015 15:18:57 EST Ventricular Rate:  77 PR Interval:   124 QRS Duration: 80 QT Interval:  378 QTC Calculation: 427 R Axis:   63 Text Interpretation:  Normal sinus rhythm with sinus arrhythmia  Nonspecific T wave abnormality T wave flattening diffusely, new Confirmed  by Mingo Amber  MD, [place] ([id number]) on 01/01/2015 5:49:28 PM      MDM   Final diagnoses:  Chest pain    35F here with chest pain. Present for past few weeks, related to being around her boss. Heaviness, nonradiating, only in central chest. Better with rest. No hx of CAD, cardiac cath.  AFVSS here. 1/10 CP, alleivated with NTG and aspirin. HEART score of 3 - (1 risk factor, age, moderately suspicious story) EKG with mild T wave flattening, but no inversions - nonspecific. Patient has PCP f/u in 5 days, given # for Cards, can call in 3 days for referral for stress test.  Serial troponins  normal.     Evelina Bucy, MD 01/02/15 [phone]

## 2015-01-01 NOTE — Telephone Encounter (Signed)
Pt called to ask if it would be all right to up the dosage of her medication.  Have been under a lot of stress at work.  Left 3 emails prior to provider.  Haven't heard back and really need to speak with you.

## 2015-01-01 NOTE — Discharge Instructions (Signed)

## 2015-01-04 ENCOUNTER — Telehealth: Payer: Self-pay | Admitting: Family Medicine

## 2015-01-04 ENCOUNTER — Other Ambulatory Visit: Payer: Self-pay | Admitting: Family Medicine

## 2015-01-04 ENCOUNTER — Encounter: Payer: Self-pay | Admitting: Family Medicine

## 2015-01-04 DIAGNOSIS — R079 Chest pain, unspecified: Secondary | ICD-10-CM | POA: Insufficient documentation

## 2015-01-04 DIAGNOSIS — F4323 Adjustment disorder with mixed anxiety and depressed mood: Secondary | ICD-10-CM

## 2015-01-04 NOTE — Telephone Encounter (Signed)
Pt called because she was told in the ER to have a stress test done. She needs a referral from her PCP so that she can schedule this. jw

## 2015-01-04 NOTE — Assessment & Plan Note (Signed)
Seen in ER for chest pain.  Will order cardiology referral for likely stress test.

## 2015-01-04 NOTE — Assessment & Plan Note (Signed)
Stress at work playing a major role.  She has an appointment with me in two days.  We decided to wait until then to make changes.

## 2015-01-06 ENCOUNTER — Ambulatory Visit (INDEPENDENT_AMBULATORY_CARE_PROVIDER_SITE_OTHER): Payer: 59 | Admitting: Family Medicine

## 2015-01-06 ENCOUNTER — Encounter: Payer: Self-pay | Admitting: Family Medicine

## 2015-01-06 VITALS — BP 115/76 | HR 69 | Temp 98.1°F | Ht 66.0 in | Wt 184.1 lb

## 2015-01-06 DIAGNOSIS — F4323 Adjustment disorder with mixed anxiety and depressed mood: Secondary | ICD-10-CM

## 2015-01-06 DIAGNOSIS — R7309 Other abnormal glucose: Secondary | ICD-10-CM

## 2015-01-06 DIAGNOSIS — R0789 Other chest pain: Secondary | ICD-10-CM

## 2015-01-06 DIAGNOSIS — R7303 Prediabetes: Secondary | ICD-10-CM

## 2015-01-06 DIAGNOSIS — E119 Type 2 diabetes mellitus without complications: Secondary | ICD-10-CM

## 2015-01-06 LAB — POCT GLYCOSYLATED HEMOGLOBIN (HGB A1C): Hemoglobin A1C: 6.1

## 2015-01-06 NOTE — Patient Instructions (Signed)
Hang in there and enjoy that grandbaby Please do see the cardiologist I will call with the lab results. When you see the eye doctor, please have her send me a copy of the report.

## 2015-01-07 LAB — MICROALBUMIN / CREATININE URINE RATIO
CREATININE, URINE: 98.7 mg/dL
MICROALB/CREAT RATIO: 6.1 mg/g (ref 0.0–30.0)
Microalb, Ur: 0.6 mg/dL (ref <2.0)

## 2015-01-07 NOTE — Assessment & Plan Note (Signed)
I now conceptualize her as prediabetes.  Also could be considered very well controled diabetes 2 by diet and exercise alone, no meds.

## 2015-01-07 NOTE — Progress Notes (Signed)
   Subjective:    Patient ID: Michele Carter, female    DOB: [birth year], 54 y.o.   MRN: [id number]  HPI Several issues: Major stress at work.  We discussed by phone and I increased her celexa.  Will give that time to work. Early DM 2 versus prediabetes.  She has improved diet, lost weight and exercised more.  No meds.  Will recheck A1C Seen in ER 2 days ago with chest pain.  No SOB.  No risk factors for PE.  Does have mild risk fractors for CAD.  Pain is substernal pressure that occurs randomly.  Not related to eating or activity.  Really sounds atypical for angina.  EKG and trops in ER were neg.  I think low risk but I did put in cardiac referral and likely needs some sort of stress testing.    Review of Systems     Objective:   Physical ExamLungs clear Cardiac RRR without m or g  No chest wall tenderness. Abd benign        Assessment & Plan:

## 2015-01-07 NOTE — Assessment & Plan Note (Signed)
Wait to see how she responds to higher dose celexa and how situation at work evolves.

## 2015-01-07 NOTE — Assessment & Plan Note (Signed)
Cards referral.  Low probability for CAD.  Does have risk factors.

## 2015-01-11 ENCOUNTER — Encounter: Payer: Self-pay | Admitting: Family Medicine

## 2015-01-11 ENCOUNTER — Ambulatory Visit (INDEPENDENT_AMBULATORY_CARE_PROVIDER_SITE_OTHER): Payer: 59 | Admitting: Cardiology

## 2015-01-11 ENCOUNTER — Encounter: Payer: Self-pay | Admitting: Cardiology

## 2015-01-11 VITALS — BP 116/78 | HR 71 | Ht 66.0 in | Wt 182.4 lb

## 2015-01-11 DIAGNOSIS — R7309 Other abnormal glucose: Secondary | ICD-10-CM

## 2015-01-11 DIAGNOSIS — R7303 Prediabetes: Secondary | ICD-10-CM

## 2015-01-11 DIAGNOSIS — E785 Hyperlipidemia, unspecified: Secondary | ICD-10-CM

## 2015-01-11 DIAGNOSIS — R079 Chest pain, unspecified: Secondary | ICD-10-CM

## 2015-01-11 NOTE — Progress Notes (Signed)
Cardiology Office Note   Date:  01/11/2015   ID:  Michele Carter, DOB [birth year], MRN [id number]  PCP:  Zigmund Gottron, MD  Cardiologist:   Sueanne Margarita, MD   Chief Complaint  Patient presents with  . Chest pain      History of Present Illness: Michele Carter is a 54 y.o. female who presents for evaluation of chest pain.  She has a history of prediabetes and dyslipidemia and is now referred for further evaulation.  She was seen in the ER on 01/01/2015 and it was recommended that she followup with Cardiology.  She says that she has had several episodes of chest pain over the past 2 weeks off and on.  She has been under a lot of stress at work and takes antianxiety meds.  This was increased last Monday but did not like the way she felt so she stopped it.  She describes the CP as mid sternal pressure that does not radiate. It can last up to an hour.  Sometimes it improves with antacids. She denies any SOB, DOE, diaphoresis or nausea with the pain.  She does an aerobic class weekly that she tolerates well with no CP but does occasioanally get DOE with it. She denies any palpitations, dizziness or syncope. 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  She saw her dentist who felt the pain was not related to TMJ.    Past Medical History  Diagnosis Date  . IBS (irritable bowel syndrome)   . Hyperlipidemia   . Acid reflux   . TIA (transient ischemic attack) 1995  . Psoriatic arthritis     Past Surgical History  Procedure Laterality Date  . Cholecystectomy    . Tubal ligation    . Colonoscopy  10/05/2010    Normal--Multiple   . Esophagogastroduodenoscopy  01/04/2005     Current Outpatient Prescriptions  Medication Sig Dispense Refill  . aspirin 81 MG tablet Take 81 mg by mouth daily. Taking four tablets a day    . Calcium Carbonate-Vitamin D 600-400 MG-UNIT per tablet Take 1 tablet by mouth daily.    . citalopram (CELEXA) 20 MG tablet Take 20 mg by mouth  daily.    Scarlette Shorts SURECLICK 50 MG/ML injection Inject 50 mg as directed once a week.   2  . fenofibrate micronized (LOFIBRA) 134 MG capsule TAKE 1 CAPSULE BY MOUTH DAILY WITH A MEAL 90 capsule 3  . fluticasone (FLONASE) 50 MCG/ACT nasal spray Place 2 sprays into both nostrils daily. (Patient taking differently: Place 2 sprays into both nostrils daily. ) 16 g 6  . gabapentin (NEURONTIN) 100 MG capsule Take 1 capsule (100 mg total) by mouth 3 (three) times daily. For nerve pain (Patient taking differently: Take 100 mg by mouth as needed (patient takes as for nerve pain as needed for shingles). For nerve pain) 90 capsule 3  . hydrocortisone (ANUSOL-HC) 25 MG suppository Place 1 suppository (25 mg total) rectally 2 (two) times daily. 20 suppository 1  . meloxicam (MOBIC) 15 MG tablet Take 15 mg by mouth daily.     . Multiple Vitamins-Minerals (MULTIVITAMIN) tablet Take 1 tablet by mouth daily.    . Omega-3 Fatty Acids (FISH OIL) 1000 MG CAPS Take 1 capsule by mouth daily.      . pantoprazole (PROTONIX) 40 MG tablet TAKE 1 TABLET BY MOUTH DAILY. (Patient taking differently: TAKE 2 TABLETs BY MOUTH DAILY.) 90 tablet 3  . polyethylene glycol  powder (GLYCOLAX/MIRALAX) powder Take 255 g (1 Container total) by mouth daily. 255 g 3  . simvastatin (ZOCOR) 10 MG tablet Take 10 mg by mouth daily.      No current facility-administered medications for this visit.    Allergies:   Demerol; Penicillins; and Meperidine hcl    Social History:  The patient  reports that she has never smoked. She has never used smokeless tobacco. She reports that she does not drink alcohol or use illicit drugs.   Family History:  The patient's family history includes COPD in her other; Diabetes in her other; Hypertension in her other; Uterine cancer in her sister.    ROS:  Please see the history of present illness.   Otherwise, review of systems are positive for none.   All other systems are reviewed and negative.    PHYSICAL  EXAM: VS:  BP 116/78 mmHg  Pulse 71  Ht 5\' 6"  (1.676 m)  Wt 182 lb 6.4 oz (82.736 kg)  BMI 29.45 kg/m2  SpO2 97% , BMI Body mass index is 29.45 kg/(m^2). GEN: Well nourished, well developed, in no acute distress HEENT: normal Neck: no JVD, carotid bruits, or masses Cardiac: RRR; no murmurs, rubs, or gallops,no edema  Respiratory:  clear to auscultation bilaterally, normal work of breathing GI: soft, nontender, nondistended, + BS MS: no deformity or atrophy Skin: warm and dry, no rash Neuro:  Strength and sensation are intact Psych: euthymic mood, full affect    EKG:  EKG is not ordered today. The ekg done 01/02/2015 demonstrates NSR with no ST changes   Recent Labs: 01/01/2015: BUN 10; Creatinine 0.74; Hemoglobin 13.6; Platelets 297; Potassium 4.1; Sodium 143    Lipid Panel    Component Value Date/Time   CHOL 228 10/13/2010   TRIG 118 06/05/2013   TRIG 221 10/13/2010   HDL 41 06/05/2013   CHOLHDL 5.3 Ratio 06/10/2010 2055   VLDL 42* 06/10/2010 2055   LDLCALC 103 06/05/2013   LDLCALC 142 10/13/2010   LDLDIRECT 109* 01/19/2011 2022      Wt Readings from Last 3 Encounters:  01/11/15 182 lb 6.4 oz (82.736 kg)  01/06/15 184 lb 1.6 oz (83.507 kg)  07/15/14 180 lb 12.8 oz (82.01 kg)      Other studies Reviewed: Additional studies/ records that were reviewed today include: Er reports and labs. Review of the above records demonstrates: normal labs   ASSESSMENT AND PLAN:  1.  Chest pain with CRF including prediabetes and dyslipidemia.  Her lab work in ER was normal and EKG showed no ischemic changes.  I will get a 2D echo to assess LVF and ETT to rule out ischemia 2.  Dyslipidemia - continue statin 3.  Pre diabetes - per PCP   Current medicines are reviewed at length with the patient today.  The patient does not have concerns regarding medicines.  The following changes have been made:  no change  Labs/ tests ordered today include: ETT and 2D echo   Disposition:    FU with me in PRN  Pending results of studies   SignedSueanne Margarita, MD  01/11/2015 11:59 AM    [facility] [address], [place], [place]  [address] Phone: [phone]; Fax: [phone]

## 2015-01-11 NOTE — Patient Instructions (Signed)
Your physician has requested that you have an exercise tolerance test. For further information please visit [email or web address]. Please also follow instruction sheet, as given.  Your physician has requested that you have an echocardiogram. Echocardiography is a painless test that uses sound waves to create images of your heart. It provides your doctor with information about the size and shape of your heart and how well your heart's chambers and valves are working. This procedure takes approximately one hour. There are no restrictions for this procedure.  Your physician recommends that you schedule a follow-up appointment AS NEEDED with Dr. Radford Pax pending your tests.

## 2015-01-12 NOTE — Telephone Encounter (Signed)
-----   Message from [place] to Zigmund Gottron, MD sent at 01/11/2015 9:54 AM -----     Dr. Lemmie Evens I started taking 2 doses per day of my medication for anxiety 1/18 per your instructions. I switched back to 1 per day 1/22 because I did not like how it made me feel. FYI, Cardio apt 1/25 with Dr. Radford Pax

## 2015-01-12 NOTE — Telephone Encounter (Signed)
noted 

## 2015-01-22 ENCOUNTER — Telehealth: Payer: Self-pay | Admitting: Cardiology

## 2015-01-22 ENCOUNTER — Ambulatory Visit (HOSPITAL_COMMUNITY): Payer: 59 | Attending: Cardiology | Admitting: Cardiology

## 2015-01-22 ENCOUNTER — Ambulatory Visit (INDEPENDENT_AMBULATORY_CARE_PROVIDER_SITE_OTHER): Payer: 59

## 2015-01-22 DIAGNOSIS — E785 Hyperlipidemia, unspecified: Secondary | ICD-10-CM | POA: Insufficient documentation

## 2015-01-22 DIAGNOSIS — R079 Chest pain, unspecified: Secondary | ICD-10-CM

## 2015-01-22 NOTE — Telephone Encounter (Signed)
Please let patient know that stress test was normal

## 2015-01-22 NOTE — Telephone Encounter (Addendum)
Patient informed of results and verbal understanding expressed.   

## 2015-01-22 NOTE — Progress Notes (Signed)
Echo performed. 

## 2015-01-22 NOTE — Progress Notes (Signed)
Exercise Treadmill Test  Pre-Exercise Testing Evaluation Rhythm: normal sinus  Rate: 64 bpm     Test  Exercise Tolerance Test Ordering MD: Fransico Him, MD  Interpreting MD: Melina Copa, PA-C  Unique Test No: 1  Treadmill:  1  Indication for ETT: chest pain - rule out ischemia  Contraindication to ETT: No   Stress Modality: exercise - treadmill  Cardiac Imaging Performed: non   Protocol: standard Bruce - maximal  Max BP:  1543/74  Max MPHR (bpm):  165 85% MPR (bpm):  140  MPHR obtained (bpm):  155 % MPHR obtained:  93  Reached 85% MPHR (min:sec):  7:17 Total Exercise Time (min-sec):  9:00  Workload in METS:  10.1 Borg Scale: 15-16  Reason ETT Terminated:  Leg fatigue    ST Segment Analysis At Rest: normal ST segments - no evidence of significant ST depression With Exercise: no evidence of significant ST depression  Other Information Arrhythmia:  No Angina during ETT:  absent (0) Quality of ETT:  diagnostic  ETT Interpretation:  normal - no evidence of ischemia by ST analysis  Comments: Good exercise tolerance. EKG with +++artifact at peak exercise (unable to improve this with belt/lead adjustments). Immediately post-exercise we had patient remain very still while EKG was obtained when she was still at target HR. No acute changes. No CP. Dyspnea was appropriate for level of exertion. Occasional PVCs in recovery, asymptomatic.  Recommendations: No evidence of ischemia by ETT. F/u plans per Dr. Radford Pax.  Dayna Dunn PA-C 01/22/2015 12:26 PM

## 2015-01-27 ENCOUNTER — Encounter: Payer: Self-pay | Admitting: Family Medicine

## 2015-01-28 ENCOUNTER — Telehealth: Payer: Self-pay | Admitting: Cardiology

## 2015-01-28 NOTE — Telephone Encounter (Signed)
Left message for patient she will be called later today.

## 2015-01-28 NOTE — Telephone Encounter (Signed)
New problem    Pt want to know results of her ETT that was done last week. Please advise pt

## 2015-01-28 NOTE — Telephone Encounter (Signed)
Patient inquiring about ECHO results. Apologized, stating normal results are released to MyChart and it is unknown why she didn't receive an alert message. Explained normal results. Patient grateful for callback.

## 2015-01-28 NOTE — Telephone Encounter (Signed)
-----   Message from [place] to Zigmund Gottron, MD sent at 01/27/2015 11:54 AM -----     Working on my grievance and need to know when you started treating me for anxiety. Thanks

## 2015-02-03 ENCOUNTER — Other Ambulatory Visit: Payer: Self-pay | Admitting: Family Medicine

## 2015-02-19 ENCOUNTER — Other Ambulatory Visit: Payer: Self-pay

## 2015-02-19 DIAGNOSIS — Z1231 Encounter for screening mammogram for malignant neoplasm of breast: Secondary | ICD-10-CM

## 2015-02-22 ENCOUNTER — Other Ambulatory Visit: Payer: Self-pay | Admitting: Family Medicine

## 2015-02-22 DIAGNOSIS — G63 Polyneuropathy in diseases classified elsewhere: Secondary | ICD-10-CM

## 2015-02-22 DIAGNOSIS — M5030 Other cervical disc degeneration, unspecified cervical region: Secondary | ICD-10-CM

## 2015-02-22 DIAGNOSIS — M5440 Lumbago with sciatica, unspecified side: Secondary | ICD-10-CM

## 2015-02-22 NOTE — Assessment & Plan Note (Signed)
Refill per e request 

## 2015-02-26 ENCOUNTER — Ambulatory Visit (INDEPENDENT_AMBULATORY_CARE_PROVIDER_SITE_OTHER): Payer: 59 | Admitting: Family Medicine

## 2015-02-26 ENCOUNTER — Encounter: Payer: Self-pay | Admitting: Family Medicine

## 2015-02-26 VITALS — BP 123/72 | HR 67 | Temp 98.2°F | Ht 66.0 in | Wt 184.3 lb

## 2015-02-26 DIAGNOSIS — E785 Hyperlipidemia, unspecified: Secondary | ICD-10-CM

## 2015-02-26 DIAGNOSIS — F4323 Adjustment disorder with mixed anxiety and depressed mood: Secondary | ICD-10-CM

## 2015-02-26 DIAGNOSIS — K2100 Gastro-esophageal reflux disease with esophagitis, without bleeding: Secondary | ICD-10-CM

## 2015-02-26 LAB — LIPID PANEL
CHOL/HDL RATIO: 3.6 ratio
Cholesterol: 151 mg/dL (ref 0–200)
HDL: 42 mg/dL — ABNORMAL LOW (ref >=46)
LDL CALC: 89 mg/dL (ref 0–99)
TRIGLYCERIDES: 98 mg/dL (ref <150)
VLDL: 20 mg/dL (ref 0–40)

## 2015-02-26 MED ORDER — OMEPRAZOLE 40 MG PO CPDR: 40.0000 mg | DELAYED_RELEASE_CAPSULE | Freq: Two times a day (BID) | ORAL | Status: DC

## 2015-02-26 MED ORDER — SIMVASTATIN 10 MG PO TABS: 10.0000 mg | ORAL_TABLET | Freq: Every day | ORAL | Status: DC

## 2015-02-26 MED ORDER — FENOFIBRATE MICRONIZED 134 MG PO CAPS: ORAL_CAPSULE | ORAL | Status: DC

## 2015-02-26 MED ORDER — CITALOPRAM HYDROBROMIDE 20 MG PO TABS: 40.0000 mg | ORAL_TABLET | Freq: Every day | ORAL | Status: DC

## 2015-02-26 NOTE — Assessment & Plan Note (Signed)
Refilled meds and printed.  She needs to find the cheapest pharmacy post [employer] employment.

## 2015-02-26 NOTE — Assessment & Plan Note (Signed)
Switch to generic omeprazole.  Not interested in surgery.  Still with cough.

## 2015-02-26 NOTE — Assessment & Plan Note (Addendum)
Switch to bid.  Worse due to life events.

## 2015-02-26 NOTE — Patient Instructions (Signed)
Good luck with this change.  Let me know how I can help. By our list, the simvastatin and citalopram will be cheapest at [facility]. I will be happy to refill through pharm, just call If things are going great in one year. I will call with lab results

## 2015-02-26 NOTE — Progress Notes (Signed)
   Subjective:    Patient ID: Michele Carter, female    DOB: 04-08-1961, 54 y.o.   MRN: [id number]  HPI Work situation is being settled - but not in a good way.  Her grievance was denied.  She decided to retire as of April 1.  Finances will be tight with meds and need to purchase health insurance.  Anxiety is worse. Spent much of visit reviewing meds, discussing alternatives and looking at our price list where they might be cheapest.     Review of Systems     Objective:   Physical Exam Lungs clear Cardiac RRR without m or g Affect normal, coping nicely.        Assessment & Plan:

## 2015-03-30 ENCOUNTER — Ambulatory Visit: Payer: 59

## 2015-03-30 ENCOUNTER — Ambulatory Visit
Admission: RE | Admit: 2015-03-30 | Discharge: 2015-03-30 | Disposition: A | Discharge status: Home / Self Care | Payer: 59 | Source: Ambulatory Visit

## 2015-03-30 DIAGNOSIS — Z1231 Encounter for screening mammogram for malignant neoplasm of breast: Secondary | ICD-10-CM

## 2015-04-09 ENCOUNTER — Other Ambulatory Visit: Payer: Self-pay | Admitting: *Deleted

## 2015-04-09 NOTE — Patient Outreach (Signed)
Received email from patient stating she has retired from [employer] and will no longer have [employer] after April 30 so will disenroll her from the [employer] [facility] program. Barrington Ellison RN,CCM,CDE [place] Management Coordinator Office Phone [phone] Office Fax [phone]

## 2015-04-12 ENCOUNTER — Telehealth: Payer: Self-pay | Admitting: Family Medicine

## 2015-04-12 DIAGNOSIS — R7303 Prediabetes: Secondary | ICD-10-CM

## 2015-04-12 MED ORDER — LANCETS MISC: Status: DC

## 2015-04-12 MED ORDER — GLUCOSE BLOOD VI STRP: ORAL_STRIP | Status: DC

## 2015-04-12 NOTE — Telephone Encounter (Signed)
Michele Carter need 63m of test strips and lancets for her glucometer due to her insurance expiring this month.

## 2015-04-12 NOTE — Telephone Encounter (Signed)
Done as requested.

## 2015-04-19 ENCOUNTER — Telehealth: Payer: Self-pay | Admitting: Family Medicine

## 2015-04-19 NOTE — Telephone Encounter (Signed)
Spoke with pt, she has had Diarrhea x 4 days and is unable to "keep anything down"  She is agreeable to appointment in the am (full today).  Advised to use BRAT diet and she will try immodium as she has not used an antidiarrheal yet.  Pt states "I am able to keep water down and have been drinking plenty". [place], Salome Spotted

## 2015-04-19 NOTE — Telephone Encounter (Signed)
Will forward to RN pool. Jazmin Hartsell,CMA

## 2015-04-19 NOTE — Telephone Encounter (Signed)
Pt called because she is unsure on what she should do. She is throwing up and has diarrhea. When she eats solid food. This has been going on all weekend. She would like a nurse to call her. jw

## 2015-04-19 NOTE — Telephone Encounter (Signed)
Returned call to patient and left message to call our office back.  Burna Forts, BSN, RN-BC

## 2015-04-20 ENCOUNTER — Ambulatory Visit: Payer: 59 | Admitting: Family Medicine

## 2015-05-24 ENCOUNTER — Other Ambulatory Visit: Payer: Self-pay | Admitting: *Deleted

## 2015-05-24 ENCOUNTER — Encounter: Payer: Self-pay | Admitting: Family Medicine

## 2015-05-24 DIAGNOSIS — E785 Hyperlipidemia, unspecified: Secondary | ICD-10-CM

## 2015-05-26 MED ORDER — FENOFIBRATE MICRONIZED 134 MG PO CAPS: ORAL_CAPSULE | ORAL | Status: DC

## 2015-05-28 NOTE — Telephone Encounter (Signed)
Called and discussed how to wean the celexa. She will see me if more issues with the back back.  For now the gabapentin is managing the pain.

## 2015-06-08 ENCOUNTER — Telehealth: Payer: Self-pay | Admitting: Family Medicine

## 2015-06-08 NOTE — Telephone Encounter (Signed)
Discussed symptomatic relief for this viral illness.

## 2015-06-08 NOTE — Telephone Encounter (Signed)
Was at ED on Sunday with granddaughter who had hand foot and mouth  Pt has it now, can something be called in for her? Pharmacy--[facility] in [place]  Please advise

## 2015-06-24 ENCOUNTER — Ambulatory Visit (INDEPENDENT_AMBULATORY_CARE_PROVIDER_SITE_OTHER): Payer: 59 | Admitting: Family Medicine

## 2015-06-24 ENCOUNTER — Encounter: Payer: Self-pay | Admitting: Family Medicine

## 2015-06-24 VITALS — BP 120/68 | HR 80 | Temp 97.9°F | Wt 185.8 lb

## 2015-06-24 DIAGNOSIS — R0982 Postnasal drip: Secondary | ICD-10-CM

## 2015-06-24 DIAGNOSIS — H6983 Other specified disorders of Eustachian tube, bilateral: Secondary | ICD-10-CM

## 2015-06-24 NOTE — Progress Notes (Signed)
    Subjective   Michele Carter is a 54 y.o. female that presents for a same day visit  1. Throat and ear pain: Symptoms a few weeks ago. [place] daughter with a history of hand foot and mouth disease. Sore throat. 3 days ago, started having right ear pain. Throat pain has worsened. Ear pain is sharp. Has some post nasal drainage. No fevers, nausea, vomiting. She is on chronic immunosupressant for psoriatic arthritis. She has a history of hiatal hernia with reflux.  ROS Per HPI  History  Substance Use Topics  . Smoking status: Never Smoker   . Smokeless tobacco: Never Used  . Alcohol Use: No    Allergies  Allergen Reactions  . Demerol [Meperidine]   . Penicillins Other (See Comments)    REACTION: during childhood, unknown reaction  . Meperidine Hcl Other (See Comments)    REACTION: severe GI upset    Objective   BP 120/68 mmHg  Pulse 80  Temp(Src) 97.9 F (36.6 C)  Wt 185 lb 12.8 oz (84.278 kg)  General: Well appearing, no distress, pleasant HEENT: TMs normal appearing, nose normal appearing, PERRLA, EOMI, oropharynx clear with some mild erythema at the posterior aspect. Moist mucous membranes. No cervical adenopathy.   Assessment and Plan   No orders of the defined types were placed in this encounter.    Eustachian tube dysfunction Post-nasal drip  Patient to restart using Flonase  Discussed course. If does not improve in the next 1-2 weeks, may warrant antibiotic therapy  Return precautions discussed

## 2015-06-24 NOTE — Patient Instructions (Signed)
Thank you for coming to see me today. It was a pleasure. Today we talked about:   Ear and throat pain: This may be due to inflammation in your Ear/Nose/Throat system. For now, I will treat with Flonase. If symptoms fail to improve, we may try a course of antibiotics. If your symptoms worsen, please return promptly  If you have any questions or concerns, please do not hesitate to call the office at [phone].  Sincerely,  Cordelia Poche, MD

## 2015-06-25 ENCOUNTER — Ambulatory Visit: Payer: 59 | Admitting: Family Medicine

## 2015-06-28 ENCOUNTER — Telehealth: Payer: Self-pay | Admitting: Family Medicine

## 2015-06-28 NOTE — Telephone Encounter (Signed)
Pt called because she was seen by Dr. Lonny Prude on 7/8 and was told if she doesn't get any better he would call in some antibiotics. She has not gotten any better and would like something called in. 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 jw

## 2015-06-29 MED ORDER — DOXYCYCLINE HYCLATE 100 MG PO TABS: 100.0000 mg | ORAL_TABLET | Freq: Two times a day (BID) | ORAL | Status: AC

## 2015-06-29 NOTE — Telephone Encounter (Signed)
Doxycycline sent to pharmacy for a 5 day treatment course to treat presumed acute bacterial sinusitis. Sent to [facility] in [place]

## 2015-06-29 NOTE — Telephone Encounter (Signed)
Pt called again about antibotic Doesn't like that she has had to wait 24 hrs

## 2015-07-05 ENCOUNTER — Telehealth: Payer: Self-pay | Admitting: Family Medicine

## 2015-07-05 DIAGNOSIS — M47899 Other spondylosis, site unspecified: Secondary | ICD-10-CM

## 2015-07-05 NOTE — Assessment & Plan Note (Signed)
Will enter referral to rheum for ongoing care.

## 2015-07-05 NOTE — Telephone Encounter (Signed)
Pt called because she now has to have referral to see her Rheumatologist. She is going to [place] and will be seeing Dr. Trudie Reed in August. jw

## 2015-07-05 NOTE — Telephone Encounter (Signed)
Referral order entered

## 2015-07-13 ENCOUNTER — Telehealth: Payer: Self-pay | Admitting: Family Medicine

## 2015-07-13 NOTE — Telephone Encounter (Signed)
Pt informed of below. Zimmerman Rumple, Aliz Meritt D, CMA  

## 2015-07-13 NOTE — Telephone Encounter (Signed)
Pt called because her rheumatologist said that she needs another round of antibiotics. Can we call this in. jw

## 2015-07-13 NOTE — Telephone Encounter (Signed)
Pt called back. Was given doxycycline on July 12 for ear pain. She was seen July 7 by [facility] who prescribed Flonase. When she got no better, doxycycline was prescibed She would like a refill  Please advise

## 2015-07-13 NOTE — Telephone Encounter (Signed)
In general, if the rheumatologist feels that she needs antibiotics, he/she should prescribe.  At a bare minimum, I need to know what type of infection I am treating.  Called and left message.

## 2015-07-14 NOTE — Telephone Encounter (Signed)
Called.  Off flonase since began antibiotic.  Really did not give it a chance.  (max 5 days of rx.)  Explained slow onset of action.  Recommended afrin x 3 days.  Also restart flonase now.  Claritin prn.

## 2015-07-15 ENCOUNTER — Telehealth: Payer: Self-pay | Admitting: *Deleted

## 2015-07-15 NOTE — Telephone Encounter (Signed)
Prior Authorization received from [facility] pharmacy for Fenofibrate 134 mg. Formulary changed to Fenofibrate 160 mg by Dr. Andria Frames.  Prior Authorization form faxed back to [facility] with new dosage. Derl Barrow, RN

## 2015-09-13 ENCOUNTER — Telehealth: Payer: Self-pay | Admitting: Family Medicine

## 2015-09-13 DIAGNOSIS — H43812 Vitreous degeneration, left eye: Secondary | ICD-10-CM

## 2015-09-13 NOTE — Telephone Encounter (Signed)
Pt called because she needs another referral for [facility] to see Dr. Oval Linsey on 10/31. They said she needs a different referral since she is seeing a different doctor in this group. jw

## 2015-09-14 DIAGNOSIS — H43812 Vitreous degeneration, left eye: Secondary | ICD-10-CM | POA: Insufficient documentation

## 2015-10-19 ENCOUNTER — Encounter: Payer: Self-pay | Admitting: *Deleted

## 2015-10-20 ENCOUNTER — Ambulatory Visit: Payer: 59 | Admitting: Certified Registered"

## 2015-10-20 ENCOUNTER — Ambulatory Visit
Admission: RE | Admit: 2015-10-20 | Discharge: 2015-10-20 | Disposition: A | Discharge status: Home / Self Care | Payer: 59 | Source: Ambulatory Visit | Attending: Ophthalmology | Admitting: Ophthalmology

## 2015-10-20 ENCOUNTER — Encounter
Admission: RE | Disposition: A | Discharge status: Home / Self Care | Payer: Self-pay | Source: Ambulatory Visit | Attending: Ophthalmology

## 2015-10-20 ENCOUNTER — Encounter: Payer: Self-pay | Admitting: *Deleted

## 2015-10-20 DIAGNOSIS — Z9851 Tubal ligation status: Secondary | ICD-10-CM | POA: Insufficient documentation

## 2015-10-20 DIAGNOSIS — L4050 Arthropathic psoriasis, unspecified: Secondary | ICD-10-CM | POA: Insufficient documentation

## 2015-10-20 DIAGNOSIS — F419 Anxiety disorder, unspecified: Secondary | ICD-10-CM | POA: Diagnosis not present

## 2015-10-20 DIAGNOSIS — Z79899 Other long term (current) drug therapy: Secondary | ICD-10-CM | POA: Insufficient documentation

## 2015-10-20 DIAGNOSIS — Z888 Allergy status to other drugs, medicaments and biological substances status: Secondary | ICD-10-CM | POA: Diagnosis not present

## 2015-10-20 DIAGNOSIS — Z9049 Acquired absence of other specified parts of digestive tract: Secondary | ICD-10-CM | POA: Insufficient documentation

## 2015-10-20 DIAGNOSIS — Z880 Allergy status to penicillin: Secondary | ICD-10-CM | POA: Insufficient documentation

## 2015-10-20 DIAGNOSIS — E7800 Pure hypercholesterolemia, unspecified: Secondary | ICD-10-CM | POA: Insufficient documentation

## 2015-10-20 DIAGNOSIS — Z7982 Long term (current) use of aspirin: Secondary | ICD-10-CM | POA: Diagnosis not present

## 2015-10-20 DIAGNOSIS — Z9889 Other specified postprocedural states: Secondary | ICD-10-CM | POA: Diagnosis not present

## 2015-10-20 DIAGNOSIS — H33022 Retinal detachment with multiple breaks, left eye: Secondary | ICD-10-CM | POA: Insufficient documentation

## 2015-10-20 HISTORY — PX: PARS PLANA VITRECTOMY: SHX2166

## 2015-10-20 HISTORY — DX: Anxiety disorder, unspecified: F41.9

## 2015-10-20 HISTORY — DX: Personal history of other diseases of the digestive system: Z87.19

## 2015-10-20 SURGERY — PARS PLANA VITRECTOMY WITH 25 GAUGE
Anesthesia: Monitor Anesthesia Care | Laterality: Left

## 2015-10-20 MED ORDER — ATROPINE SULFATE 1 % OP SOLN
OPHTHALMIC | Status: DC | PRN
  Administered 2015-10-20: 2 [drp] via OPHTHALMIC

## 2015-10-20 MED ORDER — SODIUM CHLORIDE 0.9 % IV SOLN
INTRAVENOUS | Status: DC
  Administered 2015-10-20: 08:00:00 via INTRAVENOUS

## 2015-10-20 MED ORDER — OXYCODONE HCL 5 MG/5ML PO SOLN: 5.0000 mg | Freq: Once | ORAL | Status: DC | PRN

## 2015-10-20 MED ORDER — DEXTROSE 50 % IV SOLN
INTRAVENOUS | Status: AC
  Filled 2015-10-20: qty 50

## 2015-10-20 MED ORDER — HYALURONIDASE HUMAN 150 UNIT/ML IJ SOLN
INTRAMUSCULAR | Status: AC
  Filled 2015-10-20: qty 1

## 2015-10-20 MED ORDER — TETRACAINE HCL 0.5 % OP SOLN
OPHTHALMIC | Status: DC | PRN
  Administered 2015-10-20: 2 [drp]

## 2015-10-20 MED ORDER — ALFENTANIL 500 MCG/ML IJ INJ
INJECTION | INTRAMUSCULAR | Status: DC | PRN
  Administered 2015-10-20: 250 ug via INTRAVENOUS
  Administered 2015-10-20: 500 ug via INTRAVENOUS
  Administered 2015-10-20: 250 ug via INTRAVENOUS

## 2015-10-20 MED ORDER — MIDAZOLAM HCL 2 MG/2ML IJ SOLN
INTRAMUSCULAR | Status: DC | PRN
  Administered 2015-10-20: 2 mg via INTRAVENOUS

## 2015-10-20 MED ORDER — BSS PLUS IO SOLN
INTRAOCULAR | Status: DC | PRN
  Administered 2015-10-20: 1 via INTRAOCULAR

## 2015-10-20 MED ORDER — HYPROMELLOSE 0.3 % OP GEL
OPHTHALMIC | Status: AC
  Filled 2015-10-20: qty 3.5

## 2015-10-20 MED ORDER — CYCLOPENTOLATE HCL 2 % OP SOLN
1.0000 [drp] | OPHTHALMIC | Status: AC | PRN
  Administered 2015-10-20 (×3): 1 [drp] via OPHTHALMIC

## 2015-10-20 MED ORDER — FENTANYL CITRATE (PF) 100 MCG/2ML IJ SOLN
INTRAMUSCULAR | Status: AC
  Filled 2015-10-20: qty 2

## 2015-10-20 MED ORDER — TETRACAINE HCL 0.5 % OP SOLN
OPHTHALMIC | Status: AC
  Filled 2015-10-20: qty 2

## 2015-10-20 MED ORDER — DEXAMETHASONE SODIUM PHOSPHATE 10 MG/ML IJ SOLN
INTRAMUSCULAR | Status: DC | PRN
  Administered 2015-10-20: .2 mg

## 2015-10-20 MED ORDER — OXYCODONE HCL 5 MG PO TABS: 5.0000 mg | ORAL_TABLET | Freq: Once | ORAL | Status: DC | PRN

## 2015-10-20 MED ORDER — BSS PLUS IO SOLN
Freq: Once | INTRAOCULAR | Status: DC
  Filled 2015-10-20: qty 500

## 2015-10-20 MED ORDER — HYPROMELLOSE 0.3 % OP GEL
OPHTHALMIC | Status: DC | PRN
  Administered 2015-10-20: 1 via OPHTHALMIC

## 2015-10-20 MED ORDER — BUPIVACAINE HCL (PF) 0.75 % IJ SOLN
INTRAMUSCULAR | Status: AC
  Filled 2015-10-20: qty 10

## 2015-10-20 MED ORDER — PHENYLEPHRINE HCL 10 % OP SOLN
OPHTHALMIC | Status: AC
  Administered 2015-10-20: 1 [drp] via OPHTHALMIC
  Filled 2015-10-20: qty 5

## 2015-10-20 MED ORDER — DEXAMETHASONE SODIUM PHOSPHATE 10 MG/ML IJ SOLN
INTRAMUSCULAR | Status: AC
  Filled 2015-10-20: qty 1

## 2015-10-20 MED ORDER — LIDOCAINE HCL (PF) 4 % IJ SOLN
INTRAMUSCULAR | Status: AC
  Filled 2015-10-20: qty 5

## 2015-10-20 MED ORDER — CYCLOPENTOLATE HCL 2 % OP SOLN
OPHTHALMIC | Status: AC
  Administered 2015-10-20: 1 [drp] via OPHTHALMIC
  Filled 2015-10-20: qty 2

## 2015-10-20 MED ORDER — ATROPINE SULFATE 1 % OP SOLN
OPHTHALMIC | Status: AC
  Filled 2015-10-20: qty 5

## 2015-10-20 MED ORDER — NEOMYCIN-POLYMYXIN-DEXAMETH 0.1 % OP OINT
TOPICAL_OINTMENT | OPHTHALMIC | Status: DC | PRN
  Administered 2015-10-20: 1 via OPHTHALMIC

## 2015-10-20 MED ORDER — FENTANYL CITRATE (PF) 100 MCG/2ML IJ SOLN
25.0000 ug | INTRAMUSCULAR | Status: DC | PRN
  Administered 2015-10-20: 25 ug via INTRAVENOUS

## 2015-10-20 MED ORDER — NEOMYCIN-POLYMYXIN-DEXAMETH 3.5-10000-0.1 OP OINT
TOPICAL_OINTMENT | OPHTHALMIC | Status: AC
  Filled 2015-10-20: qty 3.5

## 2015-10-20 MED ORDER — PHENYLEPHRINE HCL 10 % OP SOLN
1.0000 [drp] | OPHTHALMIC | Status: AC | PRN
  Administered 2015-10-20 (×3): 1 [drp] via OPHTHALMIC

## 2015-10-20 MED ORDER — LIDOCAINE HCL (PF) 4 % IJ SOLN
INTRAMUSCULAR | Status: DC | PRN
  Administered 2015-10-20: 10:00:00 via OPHTHALMIC

## 2015-10-20 SURGICAL SUPPLY — 32 items
APPLICATOR COTTON TIP 6IN STRL (MISCELLANEOUS) ×8 IMPLANT
CANNULA SOFT TIP 25G (CANNULA) ×1 IMPLANT
CORD BIP STRL DISP 12FT (MISCELLANEOUS) ×1 IMPLANT
CUP MEDICINE 2OZ PLAST GRAD ST (MISCELLANEOUS) ×2 IMPLANT
ERASER HMR WETFIELD 25G (MISCELLANEOUS) IMPLANT
FILTER MILLEX .045 (MISCELLANEOUS) IMPLANT
FLTR MILLEX .045 (MISCELLANEOUS) ×2
FORCEPS GRIESH GRASP 25G (INSTRUMENTS) IMPLANT
FORCEPS GRIESH ILM PLUS 25G (INSTRUMENTS) IMPLANT
GLOVE BIO SURGEON STRL SZ8 (GLOVE) ×2 IMPLANT
GLOVE SURG LX 6.5 MICRO (GLOVE) ×1
GLOVE SURG LX STRL 6.5 MICRO (GLOVE) ×1 IMPLANT
GOWN STRL REUS W/ TWL LRG LVL3 (GOWN DISPOSABLE) ×2 IMPLANT
GOWN STRL REUS W/TWL LRG LVL3 (GOWN DISPOSABLE) ×4
IV SET STOPCOCK EXT 40 2IN (SET/KITS/TRAYS/PACK) ×1 IMPLANT
LENS BIOM OPTIC SET 200MM DISP (MISCELLANEOUS) ×2 IMPLANT
LENS VITRECTOMY FLAT DISP (MISCELLANEOUS) IMPLANT
NDL FILTER BLUNT 18X1 1/2 (NEEDLE) ×1 IMPLANT
NDL RETROBULBAR .5 NSTRL (NEEDLE) ×2 IMPLANT
NEEDLE FILTER BLUNT 18X 1/2SAF (NEEDLE) ×1
NEEDLE FILTER BLUNT 18X1 1/2 (NEEDLE) ×1 IMPLANT
PACK EYE AFTER SURG (MISCELLANEOUS) ×2 IMPLANT
PACK VITRECTOMY (MISCELLANEOUS) ×2 IMPLANT
PACK VITRECTOMY CASSETTE 25GA (MISCELLANEOUS) ×2 IMPLANT
PROBE DIRECTIONAL LASER (MISCELLANEOUS) IMPLANT
PROBE LASER ILLUM FLEX CVD 25G (OPHTHALMIC) ×1 IMPLANT
SOL PREP PVP 2OZ (MISCELLANEOUS) ×2
SOLUTION PREP PVP 2OZ (MISCELLANEOUS) ×1 IMPLANT
STRAP SAFETY BODY (MISCELLANEOUS) ×2 IMPLANT
SUT VICRYL 7 0 TG140 8 (SUTURE) ×1 IMPLANT
SYR 50ML LL SCALE MARK (SYRINGE) ×1 IMPLANT
SYRINGE 10CC LL (SYRINGE) ×2 IMPLANT

## 2015-10-20 NOTE — H&P (Signed)
.  Previous H&P scanned in reviewed, patient examined, and no interval changes.  Please see scanned record for complete information.   

## 2015-10-20 NOTE — Op Note (Signed)
  PREOPERATIVE DIAGNOSIS: Retinal detachment with multiple tears, left eye.             POST OPERATIVE DIAGNOSIS: Retinal detachment with multiple tears, left eye.                       OPERATION PERFORMED: 25-gauge pars plana vitrectomy, air fluid exchange, endolaser and 20% SF6 left eye.                     ANESTHESIA: MAC with peribulbar block.   COMPLICATIONS: None.     BLOOD LOSS: Minimal.   SPECIMEN: None.   DESCRIPTION OF PROCEDURE: Patient was evaluated in the clinic for a retinal detachment with multiple breaks in the left eye. Barricade laser was attempted in clinic but due to the multiple breaks and large area of SRF unde rth eretinal flap decision was made to proceed with pars plana vitrectomy in the left eye.  Risks, benefits, alternatives and complications were discussed, and patient elected to proceed with PPV retinal detachment repair.  On the day of surgery, the patient was greeted in the preoperative holding area.  All questions were answered and the left eye was marked.  The patient was then taken into the operating room in the supine position.    Monitored anesthesia care was administered and Next, 5 ml of a retrobulbar block consisting of 4% xylocaine plain, 0.75% sensorcaine plain and hylenex 100u was injected.  The left eye was then prepped and draped in the usual sterile fashion.  Three 25-gauge trocars were placed in the usual positions inferotemporal, superotemporal and superonasal. The infusion cannula was checked to ensure it was in the vitreous cavity before starting the vitrectomy. A core vitrectomy was then performed. Once the core gel was removed a careful shave of the peripheral skirt for 360 degrees took place. MUltiple tears were noted superiorly and temporally, with one small superonasal break and one small inferotemporal break. Careful vitrecomy was performed around the breaks to ensure they were free of vitreous for 360 degrees.  The large flap was trimmed. The breaks  were then marked with endocautery.  Then air infusion took place and subretinal fluid was drained form the original break and the preretinal fluid was removed from the retinal surface.  Twenty percent SF6 was infused 4 times the vitreous volume and the trocars were removed and sutured if necessary to achieve airtightness. The pressure was acceptable by palpation.  Sub conjunctival dexamethasone was injected and the eye was patched with neo-poly-dex ointment and shielded. The patient was taken to the recovery area in stable condition.

## 2015-10-20 NOTE — Anesthesia Postprocedure Evaluation (Signed)
  Anesthesia Post-op Note  Patient: Michele Carter  Procedure(s) Performed: Procedure(s): PARS PLANA VITRECTOMY WITH 25 GAUGE, endolaser, gas exchange SF6 20% (Left)  Anesthesia type:MAC  Patient location: PACU  Post pain: Pain level controlled  Post assessment: Post-op Vital signs reviewed, Patient's Cardiovascular Status Stable, Respiratory Function Stable, Patent Airway and No signs of Nausea or vomiting  Post vital signs: Reviewed and stable  Last Vitals:  Filed Vitals:   10/20/15 1233  BP: 105/58  Pulse: 67  Temp: 36.8 C  Resp: 16    Level of consciousness: awake, alert  and patient cooperative  Complications: No apparent anesthesia complications

## 2015-10-20 NOTE — Transfer of Care (Signed)
Immediate Anesthesia Transfer of Care Note  Patient: Michele Carter  Procedure(s) Performed: Procedure(s): PARS PLANA VITRECTOMY WITH 25 GAUGE, endolaser, gas exchange SF6 (Left)  Patient Location: PACU  Anesthesia Type:MAC  Level of Consciousness: awake  Airway & Oxygen Therapy: Patient Spontanous Breathing  Post-op Assessment: Report given to RN  Post vital signs: Reviewed  Last Vitals:  Filed Vitals:   10/20/15 1051  BP: 132/86  Pulse: 65  Temp: 36.4 C  Resp: 12    Complications: No apparent anesthesia complications

## 2015-10-20 NOTE — Anesthesia Preprocedure Evaluation (Signed)
Anesthesia Evaluation  Patient identified by MRN, date of birth, ID band Patient awake    Reviewed: Allergy & Precautions, H&P , NPO status , Patient's Chart, lab work & pertinent test results  History of Anesthesia Complications Negative for: history of anesthetic complications  Airway Mallampati: II  TM Distance: >3 FB Neck ROM: full    Dental no notable dental hx. (+) Teeth Intact   Pulmonary neg pulmonary ROS, neg shortness of breath,    Pulmonary exam normal breath sounds clear to auscultation       Cardiovascular Exercise Tolerance: Good (-) angina(-) Past MI and (-) DOE negative cardio ROS Normal cardiovascular exam Rhythm:regular Rate:Normal     Neuro/Psych PSYCHIATRIC DISORDERS Anxiety TIA Neuromuscular disease    GI/Hepatic Neg liver ROS, hiatal hernia, GERD  Medicated and Controlled,  Endo/Other  negative endocrine ROS  Renal/GU negative Renal ROS  negative genitourinary   Musculoskeletal  (+) Arthritis ,   Abdominal   Peds  Hematology negative hematology ROS (+)   Anesthesia Other Findings Past Medical History:   IBS (irritable bowel syndrome)                               Hyperlipidemia                                               Acid reflux                                                  TIA (transient ischemic attack)                 1995         Psoriatic arthritis ([place])                                    History of hiatal hernia                                     Anxiety                                                     Past Surgical History:   CHOLECYSTECTOMY                                               TUBAL LIGATION                                                COLONOSCOPY  10/05/2010     Comment:Normal--Multiple    ESOPHAGOGASTRODUODENOSCOPY                       01/04/2005   BACK SURGERY                                                 ENDARTERECTOMY                                               BMI    Body Mass Index   30.03 kg/m 2      Reproductive/Obstetrics negative OB ROS                             Anesthesia Physical Anesthesia Plan  ASA: III  Anesthesia Plan: MAC   Post-op Pain Management:    Induction:   Airway Management Planned:   Additional Equipment:   Intra-op Plan:   Post-operative Plan:   Informed Consent: I have reviewed the patients History and Physical, chart, labs and discussed the procedure including the risks, benefits and alternatives for the proposed anesthesia with the patient or authorized representative who has indicated his/her understanding and acceptance.   Dental Advisory Given  Plan Discussed with: Anesthesiologist, CRNA and Surgeon  Anesthesia Plan Comments:         Anesthesia Quick Evaluation

## 2015-10-20 NOTE — Discharge Instructions (Signed)

## 2015-12-14 ENCOUNTER — Telehealth: Payer: Self-pay | Admitting: Family Medicine

## 2015-12-14 DIAGNOSIS — H43812 Vitreous degeneration, left eye: Secondary | ICD-10-CM

## 2015-12-14 NOTE — Telephone Encounter (Signed)
Pt called because she had her eye appointment today and needs more appointment. They [facility] said that she has used all the visits that we gave her and need additional visits going forward. jw

## 2015-12-14 NOTE — Telephone Encounter (Signed)
Pt needs [employer], referral to [facility] in [place] their Phone number is [phone] and their fax number is [phone]; her appt is today at 1 pm. States that she has redness in the eye and needs to be seen today. If we can or cannot get this done in time, please inform pt. Thank you, Fonda Kinder, ASA

## 2015-12-14 NOTE — Telephone Encounter (Signed)
Called and spoke to pt. She has a referral already for [facility]. Ottis Stain, CMA

## 2016-01-29 IMAGING — DX DG CHEST 2V
2 series · 2 of 2 positions shown · non-contrast
Comparison: 07/21/2011.

CLINICAL DATA: One week history of chest pain and shortness of
breath. Nonsmoker.

EXAM:
CHEST  2 VIEW

[chest pa]
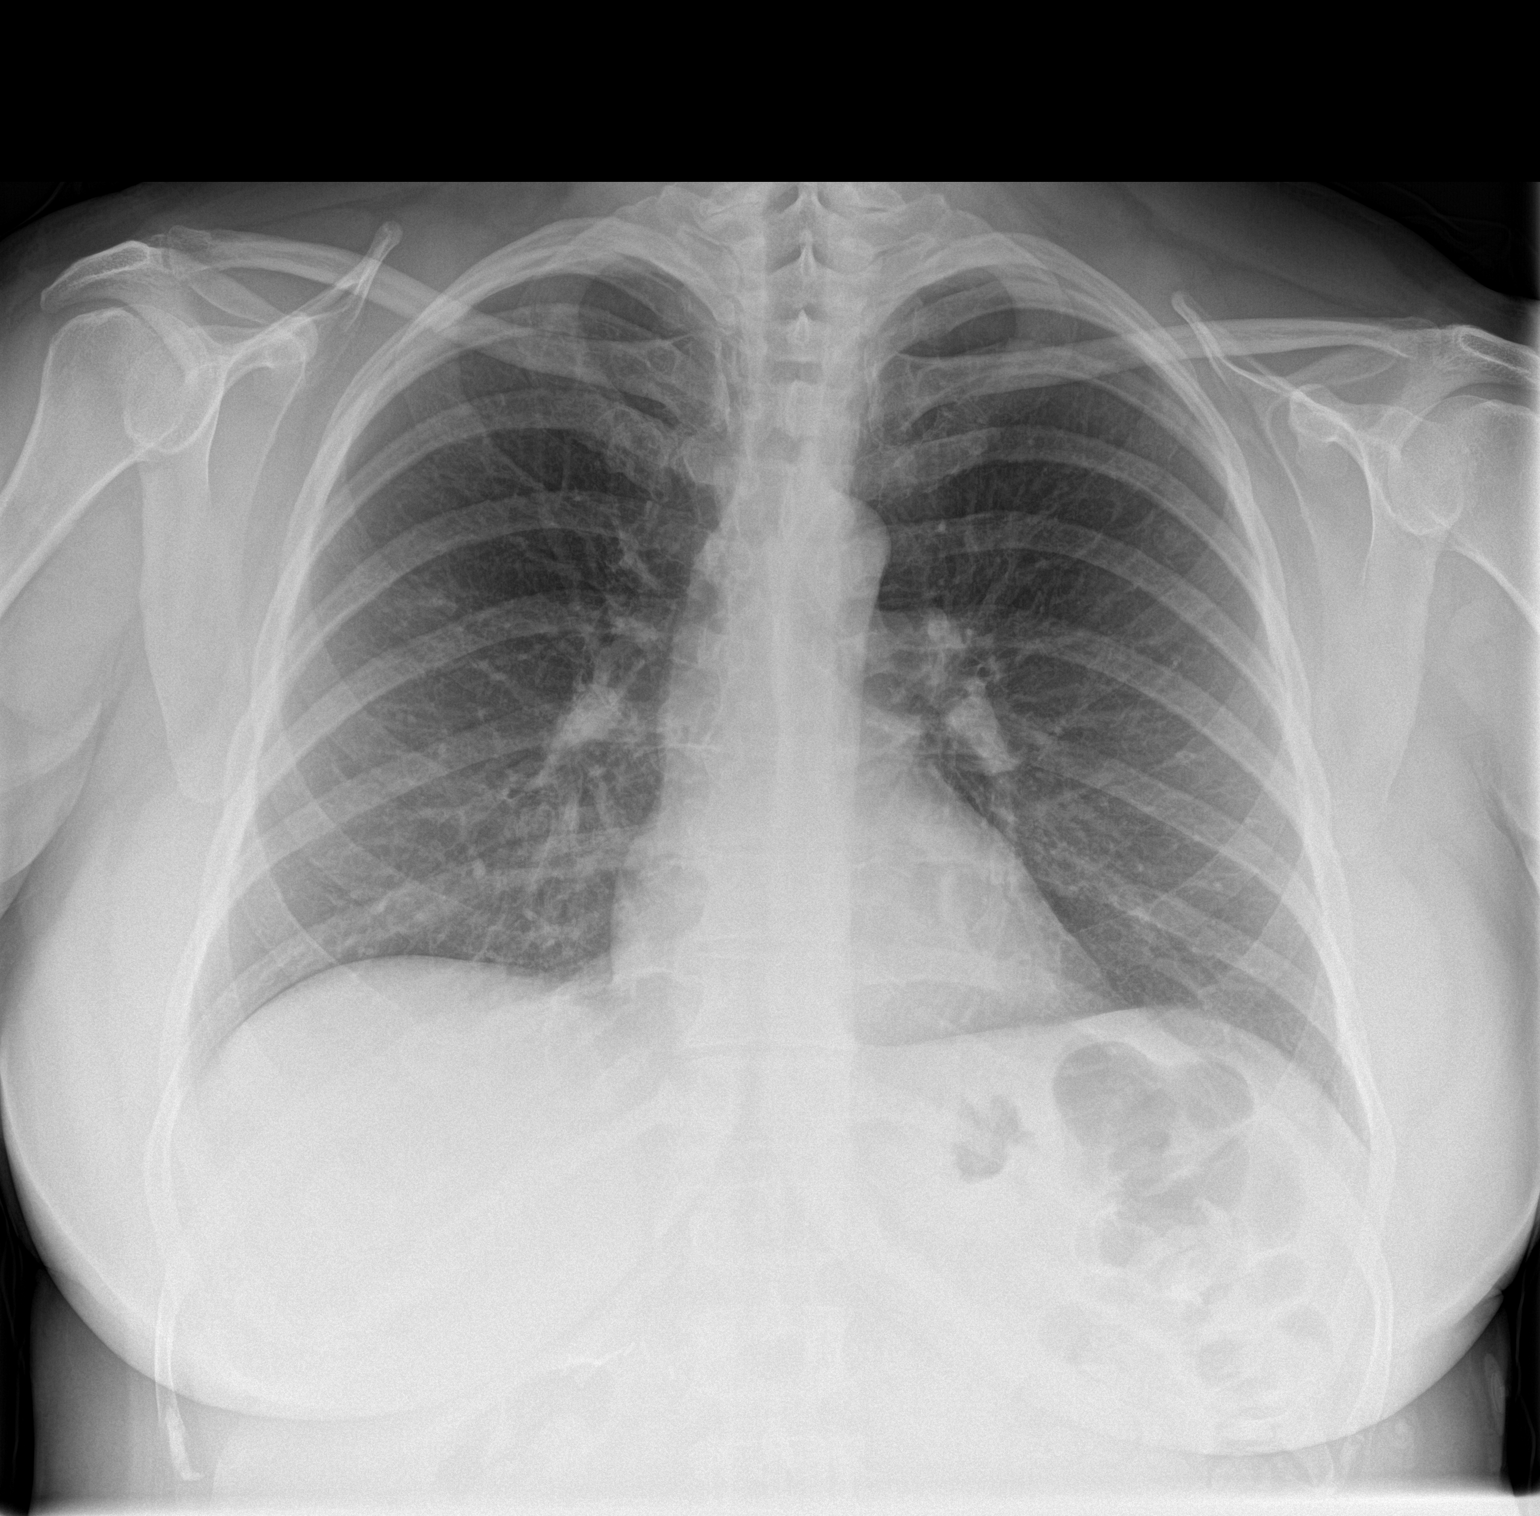

[chest lat]
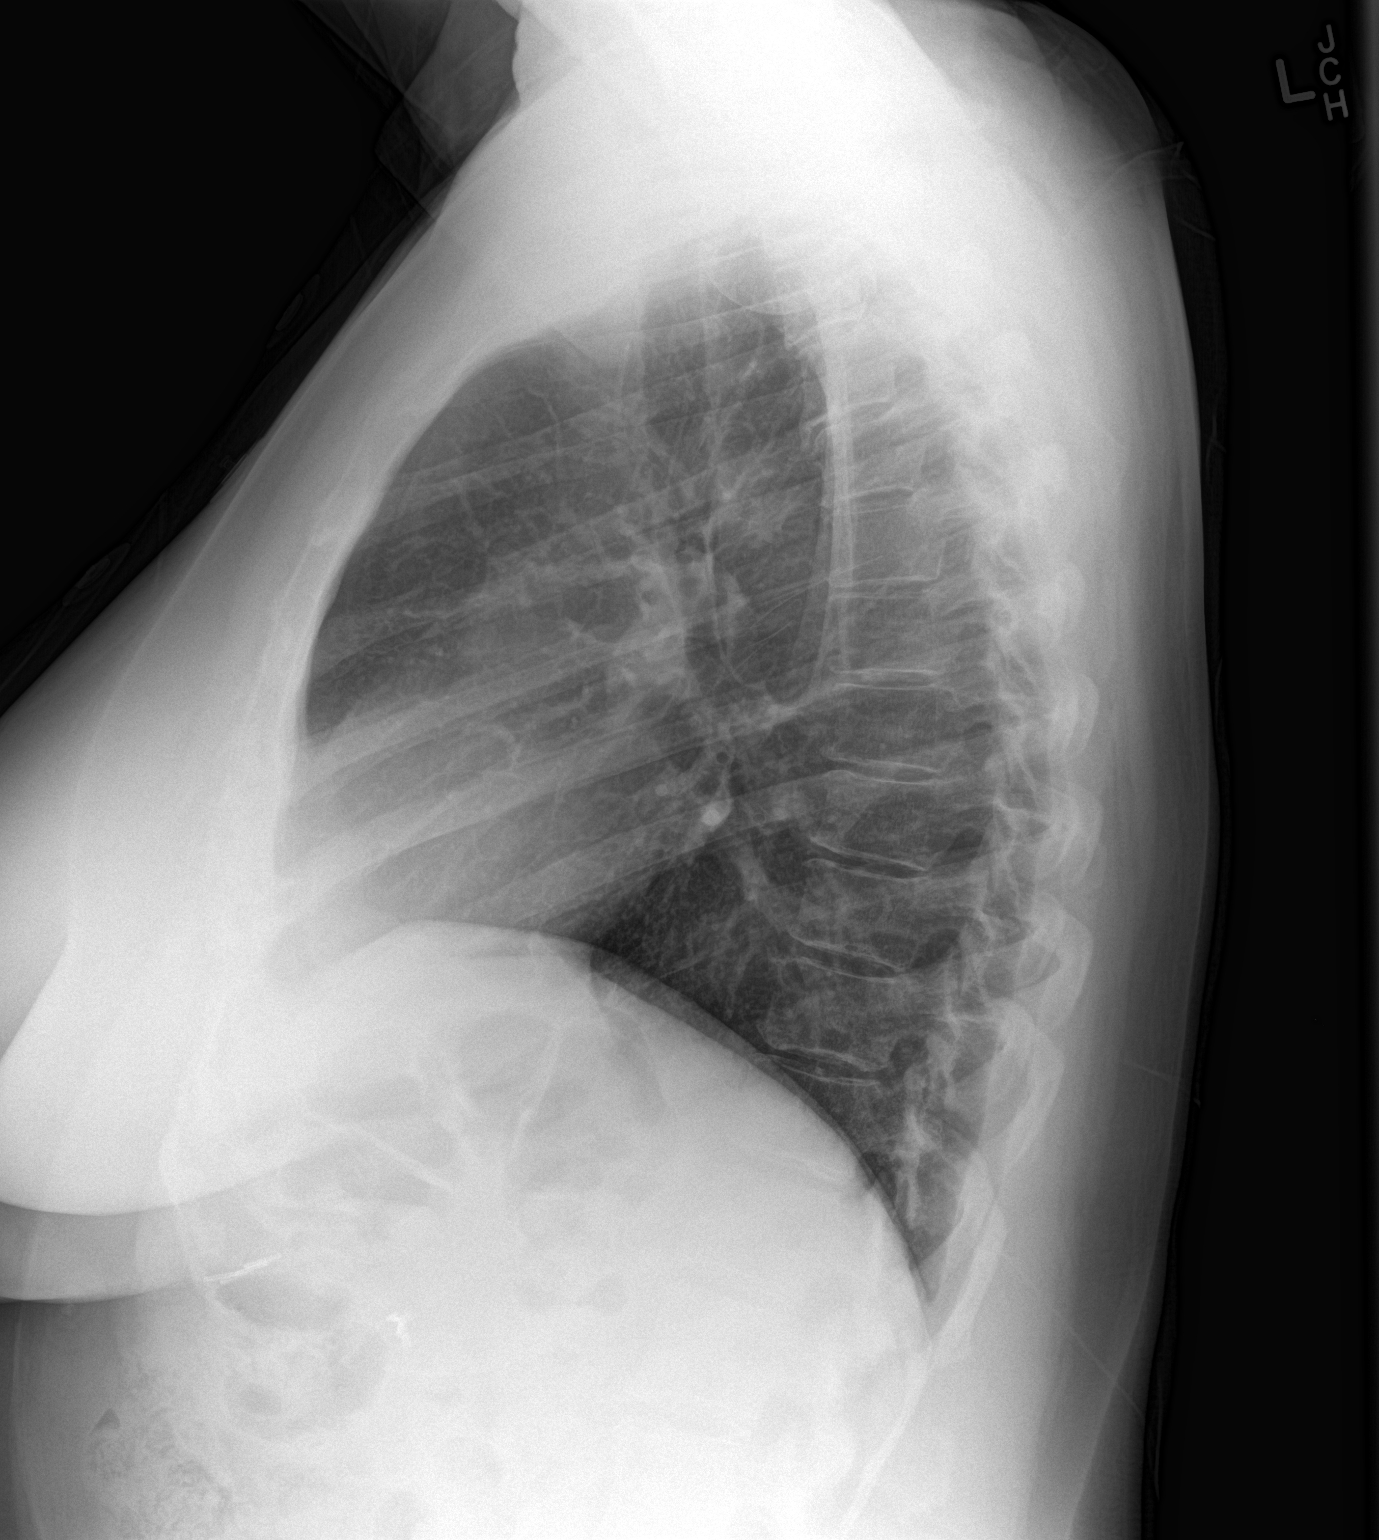

[2 of 2 positions shown; findings below may reference images not displayed]

FINDINGS: Cardiomediastinal silhouette unremarkable, unchanged. Lungs clear.
Bronchovascular markings normal. Pulmonary vascularity normal. No
visible pleural effusions. No pneumothorax. Mild degenerative
changes involving the thoracic spine. No significant interval
change.
IMPRESSION: No acute cardiopulmonary disease.  Stable examination.

## 2016-02-09 ENCOUNTER — Other Ambulatory Visit: Payer: Self-pay | Admitting: Family Medicine

## 2016-02-21 ENCOUNTER — Other Ambulatory Visit: Payer: Self-pay

## 2016-02-21 DIAGNOSIS — Z1231 Encounter for screening mammogram for malignant neoplasm of breast: Secondary | ICD-10-CM

## 2016-02-24 ENCOUNTER — Telehealth: Payer: Self-pay | Admitting: Family Medicine

## 2016-02-24 NOTE — Telephone Encounter (Signed)
Pt called because her husband was diagnosed with the flu. She has a very low immune system since she takes injections on [name] weekly. When she takes this it lowers her immune system and is do for a shot this Saturday. She wants to know should she get the shot or wait a week. Is there any medications she should be taking or can take to help protect her from catching the flu from her husband. jw

## 2016-02-25 MED ORDER — OSELTAMIVIR PHOSPHATE 75 MG PO CAPS: 75.0000 mg | ORAL_CAPSULE | Freq: Every day | ORAL | Status: DC

## 2016-02-25 NOTE — Telephone Encounter (Signed)
Tamiflu prophylaxis dose sent to patient's pharmacy of record in [place].   JB

## 2016-02-25 NOTE — Telephone Encounter (Signed)
Spoke with Dr. Lindell Noe, he will Rx her Tamiflu (to [facility] on [address]) and she is to continue her [name] injection.  Pt is aware and agreeable.  Advised to wash hands frequently. Will forward to Dr. Lindell Noe. Elizette Shek, Salome Spotted

## 2016-03-09 ENCOUNTER — Other Ambulatory Visit: Payer: Self-pay | Admitting: Family Medicine

## 2016-03-31 ENCOUNTER — Ambulatory Visit
Admission: RE | Admit: 2016-03-31 | Discharge: 2016-03-31 | Disposition: A | Discharge status: Home / Self Care | Payer: BLUE CROSS/BLUE SHIELD | Source: Ambulatory Visit

## 2016-03-31 DIAGNOSIS — Z1231 Encounter for screening mammogram for malignant neoplasm of breast: Secondary | ICD-10-CM

## 2016-06-14 ENCOUNTER — Encounter: Payer: Self-pay | Admitting: *Deleted

## 2016-06-19 ENCOUNTER — Ambulatory Visit
Admission: RE | Admit: 2016-06-19 | Discharge: 2016-06-19 | Disposition: A | Discharge status: Home / Self Care | Payer: BLUE CROSS/BLUE SHIELD | Source: Ambulatory Visit | Attending: Ophthalmology | Admitting: Ophthalmology

## 2016-06-19 ENCOUNTER — Ambulatory Visit: Payer: BLUE CROSS/BLUE SHIELD | Admitting: Anesthesiology

## 2016-06-19 ENCOUNTER — Encounter
Admission: RE | Disposition: A | Discharge status: Home / Self Care | Payer: Self-pay | Source: Ambulatory Visit | Attending: Ophthalmology

## 2016-06-19 ENCOUNTER — Encounter: Payer: Self-pay | Admitting: *Deleted

## 2016-06-19 DIAGNOSIS — M797 Fibromyalgia: Secondary | ICD-10-CM | POA: Insufficient documentation

## 2016-06-19 DIAGNOSIS — K219 Gastro-esophageal reflux disease without esophagitis: Secondary | ICD-10-CM | POA: Insufficient documentation

## 2016-06-19 DIAGNOSIS — K449 Diaphragmatic hernia without obstruction or gangrene: Secondary | ICD-10-CM | POA: Insufficient documentation

## 2016-06-19 DIAGNOSIS — Z8673 Personal history of transient ischemic attack (TIA), and cerebral infarction without residual deficits: Secondary | ICD-10-CM | POA: Diagnosis not present

## 2016-06-19 DIAGNOSIS — H2512 Age-related nuclear cataract, left eye: Secondary | ICD-10-CM | POA: Diagnosis not present

## 2016-06-19 DIAGNOSIS — E785 Hyperlipidemia, unspecified: Secondary | ICD-10-CM | POA: Diagnosis not present

## 2016-06-19 DIAGNOSIS — F329 Major depressive disorder, single episode, unspecified: Secondary | ICD-10-CM | POA: Diagnosis not present

## 2016-06-19 DIAGNOSIS — G709 Myoneural disorder, unspecified: Secondary | ICD-10-CM | POA: Diagnosis not present

## 2016-06-19 DIAGNOSIS — L4050 Arthropathic psoriasis, unspecified: Secondary | ICD-10-CM | POA: Insufficient documentation

## 2016-06-19 DIAGNOSIS — F419 Anxiety disorder, unspecified: Secondary | ICD-10-CM | POA: Insufficient documentation

## 2016-06-19 DIAGNOSIS — K589 Irritable bowel syndrome without diarrhea: Secondary | ICD-10-CM | POA: Diagnosis not present

## 2016-06-19 HISTORY — DX: Unspecified osteoarthritis, unspecified site: M19.90

## 2016-06-19 HISTORY — PX: CATARACT EXTRACTION W/PHACO: SHX586

## 2016-06-19 SURGERY — PHACOEMULSIFICATION, CATARACT, WITH IOL INSERTION
Anesthesia: Monitor Anesthesia Care | Site: Eye | Laterality: Left | Wound class: Clean

## 2016-06-19 MED ORDER — FENTANYL CITRATE (PF) 100 MCG/2ML IJ SOLN
INTRAMUSCULAR | Status: DC | PRN
  Administered 2016-06-19: 50 ug via INTRAVENOUS

## 2016-06-19 MED ORDER — EPINEPHRINE HCL 1 MG/ML IJ SOLN
INTRAMUSCULAR | Status: AC
  Filled 2016-06-19: qty 1

## 2016-06-19 MED ORDER — NA CHONDROIT SULF-NA HYALURON 40-17 MG/ML IO SOLN
INTRAOCULAR | Status: AC
  Filled 2016-06-19: qty 1

## 2016-06-19 MED ORDER — NA CHONDROIT SULF-NA HYALURON 40-17 MG/ML IO SOLN
INTRAOCULAR | Status: DC | PRN
  Administered 2016-06-19: 1 mL via INTRAOCULAR

## 2016-06-19 MED ORDER — LIDOCAINE HCL (PF) 4 % IJ SOLN
INTRAMUSCULAR | Status: AC
  Filled 2016-06-19: qty 5

## 2016-06-19 MED ORDER — CEFUROXIME OPHTHALMIC INJECTION 1 MG/0.1 ML
INJECTION | OPHTHALMIC | Status: AC
  Filled 2016-06-19: qty 0.1

## 2016-06-19 MED ORDER — EPINEPHRINE HCL 1 MG/ML IJ SOLN
INTRAOCULAR | Status: DC | PRN
  Administered 2016-06-19: 09:00:00 via OPHTHALMIC

## 2016-06-19 MED ORDER — CARBACHOL 0.01 % IO SOLN
INTRAOCULAR | Status: DC | PRN
  Administered 2016-06-19: 0.5 mL via INTRAOCULAR

## 2016-06-19 MED ORDER — MOXIFLOXACIN HCL 0.5 % OP SOLN
OPHTHALMIC | Status: DC | PRN
  Administered 2016-06-19: 1 [drp] via OPHTHALMIC

## 2016-06-19 MED ORDER — SODIUM CHLORIDE 0.9 % IV SOLN
INTRAVENOUS | Status: DC
Start: 2016-06-19 — End: 2016-06-19
  Administered 2016-06-19: 08:00:00 via INTRAVENOUS

## 2016-06-19 MED ORDER — CEFUROXIME OPHTHALMIC INJECTION 1 MG/0.1 ML: INJECTION | OPHTHALMIC | Status: DC | PRN

## 2016-06-19 MED ORDER — TETRACAINE HCL 0.5 % OP SOLN
1.0000 [drp] | Freq: Once | OPHTHALMIC | Status: AC
  Administered 2016-06-19: 1 [drp] via OPHTHALMIC

## 2016-06-19 MED ORDER — MIDAZOLAM HCL 2 MG/2ML IJ SOLN
INTRAMUSCULAR | Status: DC | PRN
Start: 2016-06-19 — End: 2016-06-19
  Administered 2016-06-19: 1 mg via INTRAVENOUS

## 2016-06-19 MED ORDER — ARMC OPHTHALMIC DILATING GEL
1.0000 "application " | OPHTHALMIC | Status: AC | PRN
  Administered 2016-06-19 (×2): 1 via OPHTHALMIC

## 2016-06-19 MED ORDER — SODIUM CHLORIDE 0.9 % IV SOLN
INTRAVENOUS | Status: DC | PRN
  Administered 2016-06-19: 09:00:00 via INTRAVENOUS

## 2016-06-19 MED ORDER — POVIDONE-IODINE 5 % OP SOLN
1.0000 "application " | Freq: Once | OPHTHALMIC | Status: AC
  Administered 2016-06-19: 1 via OPHTHALMIC

## 2016-06-19 SURGICAL SUPPLY — 22 items
CANNULA ANT/CHMB 27G (MISCELLANEOUS) ×1 IMPLANT
CANNULA ANT/CHMB 27GA (MISCELLANEOUS) ×2 IMPLANT
CUP MEDICINE 2OZ PLAST GRAD ST (MISCELLANEOUS) ×2 IMPLANT
GLOVE BIO SURGEON STRL SZ8 (GLOVE) ×2 IMPLANT
GLOVE BIOGEL M 6.5 STRL (GLOVE) ×2 IMPLANT
GLOVE SURG LX 8.0 MICRO (GLOVE) ×1
GLOVE SURG LX STRL 8.0 MICRO (GLOVE) ×1 IMPLANT
GOWN STRL REUS W/ TWL LRG LVL3 (GOWN DISPOSABLE) ×2 IMPLANT
GOWN STRL REUS W/TWL LRG LVL3 (GOWN DISPOSABLE) ×4
LENS IOL TECNIS ITEC 11.5 (Intraocular Lens) ×1 IMPLANT
PACK CATARACT (MISCELLANEOUS) ×2 IMPLANT
PACK CATARACT BRASINGTON LX (MISCELLANEOUS) ×2 IMPLANT
PACK EYE AFTER SURG (MISCELLANEOUS) ×2 IMPLANT
SOL BSS BAG (MISCELLANEOUS) ×2
SOL PREP PVP 2OZ (MISCELLANEOUS) ×2
SOLUTION BSS BAG (MISCELLANEOUS) ×1 IMPLANT
SOLUTION PREP PVP 2OZ (MISCELLANEOUS) ×1 IMPLANT
SYR 3ML LL SCALE MARK (SYRINGE) ×2 IMPLANT
SYR 5ML LL (SYRINGE) ×2 IMPLANT
SYR TB 1ML 27GX1/2 LL (SYRINGE) ×2 IMPLANT
WATER STERILE IRR 1000ML POUR (IV SOLUTION) ×2 IMPLANT
WIPE NON LINTING 3.25X3.25 (MISCELLANEOUS) ×2 IMPLANT

## 2016-06-19 NOTE — Transfer of Care (Signed)
Immediate Anesthesia Transfer of Care Note  Patient: Michele Carter  Procedure(s) Performed: Procedure(s) with comments: CATARACT EXTRACTION PHACO AND INTRAOCULAR LENS PLACEMENT (IOC) (Left) - [place] 53.7 AP% 19.6 CDE 10.57 Fluid Lot # [id number]  Patient Location: PACU  Anesthesia Type:MAC  Level of Consciousness: awake, alert  and oriented  Airway & Oxygen Therapy: Patient Spontanous Breathing  Post-op Assessment: Report given to RN  Post vital signs: Reviewed and stable  Last Vitals:  Filed Vitals:   06/19/16 0737 06/19/16 0912  BP: 112/66 118/82  Pulse: 69 66  Temp: 36.6 C 37 C  Resp: 18 16    Last Pain: There were no vitals filed for this visit.       Complications: No apparent anesthesia complications

## 2016-06-19 NOTE — Anesthesia Preprocedure Evaluation (Signed)
Anesthesia Evaluation  Patient identified by MRN, date of birth, ID band Patient awake    Reviewed: Allergy & Precautions, H&P , NPO status , Patient's Chart, lab work & pertinent test results, reviewed documented beta blocker date and time   History of Anesthesia Complications Negative for: history of anesthetic complications  Airway Mallampati: I  TM Distance: >3 FB Neck ROM: full    Dental no notable dental hx. (+) Caps   Pulmonary neg pulmonary ROS,    Pulmonary exam normal breath sounds clear to auscultation       Cardiovascular Exercise Tolerance: Good negative cardio ROS Normal cardiovascular exam Rhythm:regular Rate:Normal     Neuro/Psych neg Seizures PSYCHIATRIC DISORDERS (Anxiety and depression) TIA Neuromuscular disease (fibromyalgia)    GI/Hepatic Neg liver ROS, hiatal hernia, GERD  ,  Endo/Other  negative endocrine ROS  Renal/GU negative Renal ROS  negative genitourinary   Musculoskeletal   Abdominal   Peds  Hematology negative hematology ROS (+)   Anesthesia Other Findings Past Medical History:   IBS (irritable bowel syndrome)                               Hyperlipidemia                                               Acid reflux                                                  TIA (transient ischemic attack)                 1995         Psoriatic arthritis ([place])                                    History of hiatal hernia                                     Anxiety                                                      Arthritis                                                    Reproductive/Obstetrics negative OB ROS                             Anesthesia Physical Anesthesia Plan  ASA: II  Anesthesia Plan: MAC   Post-op Pain Management:    Induction:   Airway Management Planned:   Additional Equipment:   Intra-op Plan:   Post-operative Plan:   Informed  Consent: I have reviewed the  patients History and Physical, chart, labs and discussed the procedure including the risks, benefits and alternatives for the proposed anesthesia with the patient or authorized representative who has indicated his/her understanding and acceptance.   Dental Advisory Given  Plan Discussed with: Anesthesiologist, CRNA and Surgeon  Anesthesia Plan Comments:         Anesthesia Quick Evaluation

## 2016-06-19 NOTE — Anesthesia Procedure Notes (Signed)
Performed by: Llewelyn Sheaffer Oxygen Delivery Method: Nasal cannula     

## 2016-06-19 NOTE — Op Note (Signed)
PREOPERATIVE DIAGNOSIS:  Nuclear sclerotic cataract of the left eye.   POSTOPERATIVE DIAGNOSIS:  Nuclear sclerotic cataract of the left eye.   OPERATIVE PROCEDURE: Procedure(s): CATARACT EXTRACTION PHACO AND INTRAOCULAR LENS PLACEMENT (IOC)   SURGEON:  Birder Robson, MD.   ANESTHESIA:  Anesthesiologist: Martha Clan, MD CRNA: Lesle Reek, CRNA  1.      Managed anesthesia care. 2.      Topical tetracaine drops followed by 2% Xylocaine jelly applied in the preoperative holding area.       3.   0.2 ml of epi-Shugarcaine was  placed in the anterior chamber following the paracentesis.    COMPLICATIONS:  None.   TECHNIQUE:   Stop and chop   DESCRIPTION OF PROCEDURE:  The patient was examined and consented in the preoperative holding area where the aforementioned topical anesthesia was applied to the left eye and then brought back to the Operating Room where the left eye was prepped and draped in the usual sterile ophthalmic fashion and a lid speculum was placed. A paracentesis was created with the side port blade and the anterior chamber was filled with viscoelastic. A near clear corneal incision was performed with the steel keratome. A continuous curvilinear capsulorrhexis was performed with a cystotome followed by the capsulorrhexis forceps. Hydrodissection and hydrodelineation were carried out with BSS on a blunt cannula. The lens was removed in a stop and chop  technique and the remaining cortical material was removed with the irrigation-aspiration handpiece. The capsular bag was inflated with viscoelastic and the Technis ZCB00 lens was placed in the capsular bag without complication. The remaining viscoelastic was removed from the eye with the irrigation-aspiration handpiece. The wounds were hydrated. The anterior chamber was flushed with Miostat and the eye was inflated to physiologic pressure. 0.2 mL of Vigamox diluted three/one with BSS was placed in the anterior chamber. The wounds  were found to be water tight. The eye was dressed with Vigamox. The patient was given protective glasses to wear throughout the day and a shield with which to sleep tonight. The patient was also given drops with which to begin a drop regimen today and will follow-up with me in one day.  Implant Name Type Inv. Item Serial No. Manufacturer Lot No. LRB No. Used  LENS IOL DIOP 11.5 - [id number] Intraocular Lens LENS IOL DIOP 11.5 [id number]   Left 1    Procedure(s) with comments: CATARACT EXTRACTION PHACO AND INTRAOCULAR LENS PLACEMENT (IOC) (Left) - [place] 53.7 AP% 19.6 CDE 10.57 Fluid Lot # [id number]  Electronically signed: [place] 06/19/2016 9:13 AM

## 2016-06-19 NOTE — Anesthesia Postprocedure Evaluation (Signed)
Anesthesia Post Note  Patient: Michele Carter  Procedure(s) Performed: Procedure(s) (LRB): CATARACT EXTRACTION PHACO AND INTRAOCULAR LENS PLACEMENT (IOC) (Left)  Patient location during evaluation: PACU Anesthesia Type: MAC Level of consciousness: awake and alert Pain management: pain level controlled Vital Signs Assessment: post-procedure vital signs reviewed and stable Respiratory status: spontaneous breathing, nonlabored ventilation, respiratory function stable and patient connected to nasal cannula oxygen Cardiovascular status: blood pressure returned to baseline and stable Postop Assessment: no signs of nausea or vomiting Anesthetic complications: no    Last Vitals:  Filed Vitals:   06/19/16 0915 06/19/16 0927  BP: 128/79 129/69  Pulse: 62 61  Temp: 35.8 C   Resp: 16     Last Pain: There were no vitals filed for this visit.               Martha Clan

## 2016-06-19 NOTE — H&P (Signed)
  All labs reviewed. Abnormal studies sent to patients PCP when indicated.  Previous H&P reviewed, patient examined, there are NO CHANGES.  Michele Bristol LOUIS7/3/20178:47 AM

## 2016-06-19 NOTE — Discharge Instructions (Signed)
Eye Surgery Discharge Instructions  Expect mild scratchy sensation or mild soreness. DO NOT RUB YOUR EYE!  The day of surgery:  Minimal physical activity, but bed rest is not required  No reading, computer work, or close hand work  No bending, lifting, or straining.  May watch TV  For 24 hours:  No driving, legal decisions, or alcoholic beverages  Safety precautions  Eat anything you prefer: It is better to start with liquids, then soup then solid foods.  _____ Eye patch should be worn until postoperative exam tomorrow.  ____ Solar shield eyeglasses should be worn for comfort in the sunlight/patch while sleeping  Resume all regular medications including aspirin or Coumadin if these were discontinued prior to surgery. You may shower, bathe, shave, or wash your hair. Tylenol may be taken for mild discomfort.  Call your doctor if you experience significant pain, nausea, or vomiting, fever > 101 or other signs of infection. [phone] or [phone] Specific instructions:  Follow-up Information    Follow up with Tim Lair, MD.   Specialty:  Ophthalmology   Why:  06-19-16 at 3:55   Contact information:   [place] [address] [phone]      Eye Surgery Discharge Instructions  Expect mild scratchy sensation or mild soreness. DO NOT RUB YOUR EYE!  The day of surgery:  Minimal physical activity, but bed rest is not required  No reading, computer work, or close hand work  No bending, lifting, or straining.  May watch TV  For 24 hours:  No driving, legal decisions, or alcoholic beverages  Safety precautions  Eat anything you prefer: It is better to start with liquids, then soup then solid foods.  _____ Eye patch should be worn until postoperative exam tomorrow.  ____ Solar shield eyeglasses should be worn for comfort in the sunlight/patch while sleeping  Resume all regular medications including aspirin or Coumadin if  these were discontinued prior to surgery. You may shower, bathe, shave, or wash your hair. Tylenol may be taken for mild discomfort.  Call your doctor if you experience significant pain, nausea, or vomiting, fever > 101 or other signs of infection. [phone] or [phone] Specific instructions:  Follow-up Information    Follow up with Tim Lair, MD.   Specialty:  Ophthalmology   Why:  06-19-16 at 3:55   Contact information:   [place]  [address] [phone]

## 2016-06-28 ENCOUNTER — Encounter: Payer: Self-pay | Admitting: *Deleted

## 2016-07-04 ENCOUNTER — Ambulatory Visit
Admission: RE | Admit: 2016-07-04 | Discharge: 2016-07-04 | Disposition: A | Discharge status: Home / Self Care | Payer: BLUE CROSS/BLUE SHIELD | Source: Ambulatory Visit | Attending: Ophthalmology | Admitting: Ophthalmology

## 2016-07-04 ENCOUNTER — Ambulatory Visit: Payer: BLUE CROSS/BLUE SHIELD | Admitting: Anesthesiology

## 2016-07-04 ENCOUNTER — Encounter: Payer: Self-pay | Admitting: *Deleted

## 2016-07-04 ENCOUNTER — Encounter
Admission: RE | Disposition: A | Discharge status: Home / Self Care | Payer: Self-pay | Source: Ambulatory Visit | Attending: Ophthalmology

## 2016-07-04 DIAGNOSIS — K589 Irritable bowel syndrome without diarrhea: Secondary | ICD-10-CM | POA: Insufficient documentation

## 2016-07-04 DIAGNOSIS — Z885 Allergy status to narcotic agent status: Secondary | ICD-10-CM | POA: Insufficient documentation

## 2016-07-04 DIAGNOSIS — E7800 Pure hypercholesterolemia, unspecified: Secondary | ICD-10-CM | POA: Insufficient documentation

## 2016-07-04 DIAGNOSIS — L4050 Arthropathic psoriasis, unspecified: Secondary | ICD-10-CM | POA: Insufficient documentation

## 2016-07-04 DIAGNOSIS — H2511 Age-related nuclear cataract, right eye: Secondary | ICD-10-CM | POA: Diagnosis not present

## 2016-07-04 DIAGNOSIS — F419 Anxiety disorder, unspecified: Secondary | ICD-10-CM | POA: Diagnosis not present

## 2016-07-04 DIAGNOSIS — Z8673 Personal history of transient ischemic attack (TIA), and cerebral infarction without residual deficits: Secondary | ICD-10-CM | POA: Diagnosis not present

## 2016-07-04 DIAGNOSIS — K449 Diaphragmatic hernia without obstruction or gangrene: Secondary | ICD-10-CM | POA: Insufficient documentation

## 2016-07-04 DIAGNOSIS — K219 Gastro-esophageal reflux disease without esophagitis: Secondary | ICD-10-CM | POA: Diagnosis not present

## 2016-07-04 DIAGNOSIS — Z880 Allergy status to penicillin: Secondary | ICD-10-CM | POA: Insufficient documentation

## 2016-07-04 HISTORY — PX: CATARACT EXTRACTION W/PHACO: SHX586

## 2016-07-04 SURGERY — PHACOEMULSIFICATION, CATARACT, WITH IOL INSERTION
Anesthesia: Monitor Anesthesia Care | Site: Eye | Laterality: Right | Wound class: Clean

## 2016-07-04 MED ORDER — SODIUM CHLORIDE 0.9 % IV SOLN
INTRAVENOUS | Status: DC
  Administered 2016-07-04: 09:00:00 via INTRAVENOUS

## 2016-07-04 MED ORDER — ARMC OPHTHALMIC DILATING GEL
1.0000 "application " | OPHTHALMIC | Status: AC | PRN
  Administered 2016-07-04 (×2): 1 via OPHTHALMIC

## 2016-07-04 MED ORDER — TETRACAINE HCL 0.5 % OP SOLN
1.0000 [drp] | Freq: Once | OPHTHALMIC | Status: AC
  Administered 2016-07-04: 1 [drp] via OPHTHALMIC

## 2016-07-04 MED ORDER — MIDAZOLAM HCL 2 MG/2ML IJ SOLN
INTRAMUSCULAR | Status: DC | PRN
  Administered 2016-07-04: 1 mg via INTRAVENOUS

## 2016-07-04 MED ORDER — FENTANYL CITRATE (PF) 100 MCG/2ML IJ SOLN
INTRAMUSCULAR | Status: DC | PRN
  Administered 2016-07-04: 50 ug via INTRAVENOUS

## 2016-07-04 MED ORDER — CEFUROXIME OPHTHALMIC INJECTION 1 MG/0.1 ML
INJECTION | OPHTHALMIC | Status: AC
  Filled 2016-07-04: qty 0.1

## 2016-07-04 MED ORDER — POVIDONE-IODINE 5 % OP SOLN
OPHTHALMIC | Status: AC
  Administered 2016-07-04: 1 via OPHTHALMIC
  Filled 2016-07-04: qty 30

## 2016-07-04 MED ORDER — MOXIFLOXACIN HCL 0.5 % OP SOLN: 1.0000 [drp] | OPHTHALMIC | Status: DC | PRN

## 2016-07-04 MED ORDER — ARMC OPHTHALMIC DILATING GEL
OPHTHALMIC | Status: AC
  Administered 2016-07-04: 1 via OPHTHALMIC
  Filled 2016-07-04: qty 0.25

## 2016-07-04 MED ORDER — POVIDONE-IODINE 5 % OP SOLN
1.0000 "application " | Freq: Once | OPHTHALMIC | Status: AC
  Administered 2016-07-04: 1 via OPHTHALMIC

## 2016-07-04 MED ORDER — NA CHONDROIT SULF-NA HYALURON 40-17 MG/ML IO SOLN
INTRAOCULAR | Status: AC
  Filled 2016-07-04: qty 1

## 2016-07-04 MED ORDER — MOXIFLOXACIN HCL 0.5 % OP SOLN
OPHTHALMIC | Status: DC | PRN
Start: 2016-07-04 — End: 2016-07-04
  Administered 2016-07-04: 1 [drp] via OPHTHALMIC

## 2016-07-04 MED ORDER — NA CHONDROIT SULF-NA HYALURON 40-17 MG/ML IO SOLN
INTRAOCULAR | Status: DC | PRN
  Administered 2016-07-04: 1 mL via INTRAOCULAR

## 2016-07-04 MED ORDER — EPINEPHRINE HCL 1 MG/ML IJ SOLN
INTRAMUSCULAR | Status: AC
  Filled 2016-07-04: qty 2

## 2016-07-04 MED ORDER — EPINEPHRINE HCL 1 MG/ML IJ SOLN
INTRAOCULAR | Status: DC | PRN
  Administered 2016-07-04: 1 mL via OPHTHALMIC

## 2016-07-04 MED ORDER — LIDOCAINE HCL (PF) 4 % IJ SOLN
INTRAMUSCULAR | Status: AC
  Filled 2016-07-04: qty 5

## 2016-07-04 MED ORDER — MOXIFLOXACIN HCL 0.5 % OP SOLN
OPHTHALMIC | Status: AC
  Filled 2016-07-04: qty 3

## 2016-07-04 MED ORDER — CARBACHOL 0.01 % IO SOLN
INTRAOCULAR | Status: DC | PRN
Start: 2016-07-04 — End: 2016-07-04
  Administered 2016-07-04: .5 mL via INTRAOCULAR

## 2016-07-04 MED ORDER — TETRACAINE HCL 0.5 % OP SOLN
OPHTHALMIC | Status: AC
  Administered 2016-07-04: 1 [drp] via OPHTHALMIC
  Filled 2016-07-04: qty 2

## 2016-07-04 SURGICAL SUPPLY — 22 items
CANNULA ANT/CHMB 27G (MISCELLANEOUS) ×1 IMPLANT
CANNULA ANT/CHMB 27GA (MISCELLANEOUS) ×2 IMPLANT
CUP MEDICINE 2OZ PLAST GRAD ST (MISCELLANEOUS) ×2 IMPLANT
GLOVE BIO SURGEON STRL SZ8 (GLOVE) ×2 IMPLANT
GLOVE BIOGEL M 6.5 STRL (GLOVE) ×2 IMPLANT
GLOVE SURG LX 8.0 MICRO (GLOVE) ×1
GLOVE SURG LX STRL 8.0 MICRO (GLOVE) ×1 IMPLANT
GOWN STRL REUS W/ TWL LRG LVL3 (GOWN DISPOSABLE) ×2 IMPLANT
GOWN STRL REUS W/TWL LRG LVL3 (GOWN DISPOSABLE) ×4
LENS IOL TECNIS ITEC 6.5 (Intraocular Lens) ×1 IMPLANT
PACK CATARACT (MISCELLANEOUS) ×2 IMPLANT
PACK CATARACT BRASINGTON LX (MISCELLANEOUS) ×2 IMPLANT
PACK EYE AFTER SURG (MISCELLANEOUS) ×2 IMPLANT
SOL BSS BAG (MISCELLANEOUS) ×2
SOL PREP PVP 2OZ (MISCELLANEOUS) ×2
SOLUTION BSS BAG (MISCELLANEOUS) ×1 IMPLANT
SOLUTION PREP PVP 2OZ (MISCELLANEOUS) ×1 IMPLANT
SYR 3ML LL SCALE MARK (SYRINGE) ×2 IMPLANT
SYR 5ML LL (SYRINGE) ×2 IMPLANT
SYR TB 1ML 27GX1/2 LL (SYRINGE) ×2 IMPLANT
WATER STERILE IRR 1000ML POUR (IV SOLUTION) ×2 IMPLANT
WIPE NON LINTING 3.25X3.25 (MISCELLANEOUS) ×2 IMPLANT

## 2016-07-04 NOTE — Anesthesia Postprocedure Evaluation (Signed)
Anesthesia Post Note  Patient: Michele Carter  Procedure(s) Performed: Procedure(s) (LRB): CATARACT EXTRACTION PHACO AND INTRAOCULAR LENS PLACEMENT (IOC) (Right)  Patient location during evaluation: PACU Anesthesia Type: MAC Level of consciousness: awake, awake and alert, oriented and patient cooperative Pain management: pain level controlled Vital Signs Assessment: post-procedure vital signs reviewed and stable Respiratory status: spontaneous breathing, nonlabored ventilation and respiratory function stable Cardiovascular status: blood pressure returned to baseline and stable Postop Assessment: no headache, no backache and no signs of nausea or vomiting Anesthetic complications: no    Last Vitals:  Filed Vitals:   07/04/16 0910 07/04/16 1041  BP: 111/95 124/79  Pulse: 68 63  Temp: 36.7 C 35.9 C  Resp: 18 16    Last Pain: There were no vitals filed for this visit.               Cristela Felt

## 2016-07-04 NOTE — Anesthesia Preprocedure Evaluation (Addendum)
Anesthesia Evaluation  Patient identified by MRN, date of birth, ID band Patient awake    Reviewed: Allergy & Precautions, NPO status , Patient's Chart, lab work & pertinent test results  History of Anesthesia Complications Negative for: history of anesthetic complications  Airway Mallampati: II       Dental   Pulmonary neg pulmonary ROS,           Cardiovascular      Neuro/Psych Anxiety TIA (tingling of face)   GI/Hepatic Neg liver ROS, hiatal hernia, GERD  Medicated and Controlled,  Endo/Other  negative endocrine ROS  Renal/GU negative Renal ROS     Musculoskeletal   Abdominal   Peds  Hematology negative hematology ROS (+)   Anesthesia Other Findings   Reproductive/Obstetrics                            Anesthesia Physical Anesthesia Plan  ASA: II  Anesthesia Plan: MAC   Post-op Pain Management:    Induction: Intravenous  Airway Management Planned: Nasal Cannula  Additional Equipment:   Intra-op Plan:   Post-operative Plan:   Informed Consent: I have reviewed the patients History and Physical, chart, labs and discussed the procedure including the risks, benefits and alternatives for the proposed anesthesia with the patient or authorized representative who has indicated his/her understanding and acceptance.     Plan Discussed with:   Anesthesia Plan Comments:         Anesthesia Quick Evaluation

## 2016-07-04 NOTE — Discharge Instructions (Signed)
Eye Surgery Discharge Instructions  Expect mild scratchy sensation or mild soreness. DO NOT RUB YOUR EYE!  The day of surgery:  Minimal physical activity, but bed rest is not required  No reading, computer work, or close hand work  No bending, lifting, or straining.  May watch TV  For 24 hours:  No driving, legal decisions, or alcoholic beverages  Safety precautions  Eat anything you prefer: It is better to start with liquids, then soup then solid foods.  _____ Eye patch should be worn until postoperative exam tomorrow.  ____ Solar shield eyeglasses should be worn for comfort in the sunlight/patch while sleeping  Resume all regular medications including aspirin or Coumadin if these were discontinued prior to surgery. You may shower, bathe, shave, or wash your hair. Tylenol may be taken for mild discomfort.  Call your doctor if you experience significant pain, nausea, or vomiting, fever > 101 or other signs of infection. [phone] or [phone] Specific instructions:  Follow-up Information    Follow up with Tim Lair, MD.   Specialty:  Ophthalmology   Why:  07-05-16 at 9:40   Contact information:   [address] [place] [address] [phone]      Eye Surgery Discharge Instructions  Expect mild scratchy sensation or mild soreness. DO NOT RUB YOUR EYE!  The day of surgery:  Minimal physical activity, but bed rest is not required  No reading, computer work, or close hand work  No bending, lifting, or straining.  May watch TV  For 24 hours:  No driving, legal decisions, or alcoholic beverages  Safety precautions  Eat anything you prefer: It is better to start with liquids, then soup then solid foods.  _____ Eye patch should be worn until postoperative exam tomorrow.  ____ Solar shield eyeglasses should be worn for comfort in the sunlight/patch while sleeping  Resume all regular medications including aspirin or Coumadin if  these were discontinued prior to surgery. You may shower, bathe, shave, or wash your hair. Tylenol may be taken for mild discomfort.  Call your doctor if you experience significant pain, nausea, or vomiting, fever > 101 or other signs of infection. [phone] or [phone] Specific instructions:  Follow-up Information    Follow up with Tim Lair, MD.   Specialty:  Ophthalmology   Why:  07-05-16 at 9:40   Contact information:   [place] [address] [phone]

## 2016-07-04 NOTE — Transfer of Care (Signed)
Immediate Anesthesia Transfer of Care Note  Patient: Michele Carter  Procedure(s) Performed: Procedure(s) with comments: CATARACT EXTRACTION PHACO AND INTRAOCULAR LENS PLACEMENT (IOC) (Right) - [place] 00:52 AP% 10.6 CDE 5.56 fluid pack lot # [id number]  Patient Location: PACU  Anesthesia Type:MAC  Level of Consciousness: awake, alert , oriented and patient cooperative  Airway & Oxygen Therapy: Patient Spontanous Breathing  Post-op Assessment: Report given to RN, Post -op Vital signs reviewed and stable and Patient moving all extremities  Post vital signs: Reviewed and stable  Last Vitals:  Filed Vitals:   07/04/16 0910 07/04/16 1041  BP: 111/95 124/79  Pulse: 68 63  Temp: 36.7 C 35.9 C  Resp: 18 16    Last Pain: There were no vitals filed for this visit.       Complications: No apparent anesthesia complications

## 2016-07-04 NOTE — H&P (Signed)
  All labs reviewed. Abnormal studies sent to patients PCP when indicated.  Previous H&P reviewed, patient examined, there are NO CHANGES.  Michele Odonoghue LOUIS7/18/201710:11 AM

## 2016-07-04 NOTE — Addendum Note (Signed)
Addendum  created 07/04/16 1107 by Gunnar Fusi, MD   Modules edited: Anesthesia Attestations, Anesthesia Events, Narrator   Narrator:  Narrator: Event Log Edited

## 2016-07-04 NOTE — Op Note (Signed)
PREOPERATIVE DIAGNOSIS:  Nuclear sclerotic cataract of the right eye.   POSTOPERATIVE DIAGNOSIS:  nuclear sclerotic cataract right eye   OPERATIVE PROCEDURE: Procedure(s): CATARACT EXTRACTION PHACO AND INTRAOCULAR LENS PLACEMENT (IOC)   SURGEON:  Birder Robson, MD.   ANESTHESIA:  Anesthesiologist: Gunnar Fusi, MD CRNA: Alda Berthold, CRNA  1.      Managed anesthesia care. 2.      Topical tetracaine drops followed by 2% Xylocaine jelly applied in the preoperative holding area.   COMPLICATIONS:  None.   TECHNIQUE:   Stop and chop   DESCRIPTION OF PROCEDURE:  The patient was examined and consented in the preoperative holding area where the aforementioned topical anesthesia was applied to the right eye and then brought back to the Operating Room where the right eye was prepped and draped in the usual sterile ophthalmic fashion and a lid speculum was placed. A paracentesis was created with the side port blade and the anterior chamber was filled with viscoelastic. A near clear corneal incision was performed with the steel keratome. A continuous curvilinear capsulorrhexis was performed with a cystotome followed by the capsulorrhexis forceps. Hydrodissection and hydrodelineation were carried out with BSS on a blunt cannula. The lens was removed in a stop and chop  technique and the remaining cortical material was removed with the irrigation-aspiration handpiece. The capsular bag was inflated with viscoelastic and the Technis ZCB00  lens was placed in the capsular bag without complication. The remaining viscoelastic was removed from the eye with the irrigation-aspiration handpiece. The wounds were hydrated. The anterior chamber was flushed with Miostat and the eye was inflated to physiologic pressure. 0.2 mL of Vigamox diluted three/one with BSS was placed in the anterior chamber. The wounds were found to be water tight. The eye was dressed with Vigamox. The patient was given protective glasses  to wear throughout the day and a shield with which to sleep tonight. The patient was also given drops with which to begin a drop regimen today and will follow-up with me in one day.  Implant Name Type Inv. Item Serial No. Manufacturer Lot No. LRB No. Used  LENS IOL DIOP 06.5 - [id number] Intraocular Lens LENS IOL DIOP 06.5 [id number]   Right 1   Procedure(s) with comments: CATARACT EXTRACTION PHACO AND INTRAOCULAR LENS PLACEMENT (IOC) (Right) - [place] 00:52 AP% 10.6 CDE 5.56 fluid pack lot # [id number]  Electronically signed: [place] 07/04/2016 10:39 AM

## 2016-07-07 ENCOUNTER — Other Ambulatory Visit: Payer: Self-pay | Admitting: Family Medicine

## 2016-08-11 ENCOUNTER — Telehealth: Payer: Self-pay | Admitting: Family Medicine

## 2016-08-11 MED ORDER — CITALOPRAM HYDROBROMIDE 10 MG PO TABS: 5.0000 mg | ORAL_TABLET | Freq: Every day | ORAL | 3 refills | Status: DC

## 2016-08-11 NOTE — Telephone Encounter (Signed)
Pt is calling for a refill on her Celexa. She was only taking 10 mg 1/2 tablet and she only takes as needed so its been awhile since she needed a refill. Can we call this in to her [facility] in [place]. jw

## 2016-08-11 NOTE — Telephone Encounter (Signed)
Patient called.  Does well with regularly taking the small, 5 mg dose.  Rx sent as requested.

## 2017-02-02 ENCOUNTER — Other Ambulatory Visit: Payer: Self-pay | Admitting: Family Medicine

## 2017-02-19 ENCOUNTER — Ambulatory Visit (INDEPENDENT_AMBULATORY_CARE_PROVIDER_SITE_OTHER): Payer: BLUE CROSS/BLUE SHIELD | Admitting: Physician Assistant

## 2017-02-19 ENCOUNTER — Encounter: Payer: Self-pay | Admitting: Physician Assistant

## 2017-02-19 VITALS — BP 114/72 | HR 75 | Ht 66.0 in | Wt 190.6 lb

## 2017-02-19 DIAGNOSIS — R7303 Prediabetes: Secondary | ICD-10-CM | POA: Diagnosis not present

## 2017-02-19 DIAGNOSIS — R072 Precordial pain: Secondary | ICD-10-CM

## 2017-02-19 DIAGNOSIS — E785 Hyperlipidemia, unspecified: Secondary | ICD-10-CM | POA: Diagnosis not present

## 2017-02-19 DIAGNOSIS — Z6830 Body mass index (BMI) 30.0-30.9, adult: Secondary | ICD-10-CM

## 2017-02-19 DIAGNOSIS — E669 Obesity, unspecified: Secondary | ICD-10-CM

## 2017-02-19 DIAGNOSIS — I519 Heart disease, unspecified: Secondary | ICD-10-CM

## 2017-02-19 DIAGNOSIS — I5189 Other ill-defined heart diseases: Secondary | ICD-10-CM

## 2017-02-19 NOTE — Patient Instructions (Signed)
Medication Instructions:  None  Labwork: None  Testing/Procedures: Your physician has requested that you have an exercise tolerance test. For further information please visit [email or web address]. Please also follow instruction sheet, as given.   Follow-Up: Your physician recommends that you schedule a follow-up appointment next available with Melina Copa, PA-C after test is complete.    Your physician wants you to follow-up in: 1 year with Dr. Radford Pax.  You will receive a reminder letter in the mail two months in advance. If you don't receive a letter, please call our office to schedule the follow-up appointment.    Any Other Special Instructions Will Be Listed Below (If Applicable).     If you need a refill on your cardiac medications before your next appointment, please call your pharmacy.

## 2017-02-19 NOTE — Progress Notes (Signed)
Cardiology Office Note    Date:  02/19/2017  ID:  Michele Carter, DOB 1961-06-17, MRN [id number] PCP:  Zigmund Gottron, MD  Cardiologist:  Dr. Radford Pax   Chief Complaint: chest pain  History of Present Illness:  Michele Carter is a 56 y.o. female with history of pre-diabetes, dyslipidemia, IBS, anxiety, TIA, psoriatic arthritis who presents for evaluation of chest pain. She was seen by Dr. Radford Pax remotely in 2016 for chest pain in the setting of lots of stress at work. 2D echo 01/22/15: EF 55-60%, no RWMA, grade 1 DD. ETT 01/22/15 was nonischemic, occasional PVCs in recovery. Last labs in 2016: LDL 89, glucose 145, Cr 0.74, Hgb 13.6. She has since retired as of 03/2015.  She presents back to clinic today to discuss several episodes of chest pain. Approximately 3 weeks ago she was dealing with the end of a cold and used some essential oils directly on her chest wall. In the middle of the night she woke up abruptly with sharp stabbing piercing chest pain. She concentrated on her breathing but sx did not improve. She took 4 baby aspirin and pain improved after about an hour of intermittent pain. She ended up washing off the essential oil. 1 week later she felt a brief pain in her upper left arm described as a "pulled muscle strain" and within an hour also felt tingling in her right arm. This also improved with aspirin. Last week she felt two episodes of discomfort going across her shoulder blades while driving that eventually migrated down her back. This resolved without intervention and she's been symptom free since. No fam hx of CAD. No dyspnea, diaphoresis, nausea, vomiting, or palpitations. States her cholesterol/blood sugar are managed by her GYN with labs faxed to PCP for review. She does not exercise but states she has not had any exertional symptoms whatsoever. She is without complaint today.  Past Medical History:  Diagnosis Date  . Acid reflux   . Anxiety   . Anxiety   . Arthritis    PSORIATIC  . History of hiatal hernia   . Hyperlipidemia   . IBS (irritable bowel syndrome)   . Psoriatic arthritis ([place])   . TIA (transient ischemic attack) 1995    Past Surgical History:  Procedure Laterality Date  . BACK SURGERY    . CATARACT EXTRACTION W/PHACO Left 06/19/2016   Procedure: CATARACT EXTRACTION PHACO AND INTRAOCULAR LENS PLACEMENT (IOC);  Surgeon: Birder Robson, MD;  Location: [facility] ORS;  Service: Ophthalmology;  Laterality: Left;  [place] 53.7 AP% 19.6 CDE 10.57 Fluid Lot # [id number]  . CATARACT EXTRACTION W/PHACO Right 07/04/2016   Procedure: CATARACT EXTRACTION PHACO AND INTRAOCULAR LENS PLACEMENT ([place]);  Surgeon: Birder Robson, MD;  Location: [facility] ORS;  Service: Ophthalmology;  Laterality: Right;  [place] 00:52 AP% 10.6 CDE 5.56 fluid pack lot # [id number]  . CHOLECYSTECTOMY    . COLONOSCOPY  10/05/2010   Normal--Multiple   . ENDARTERECTOMY    . ESOPHAGOGASTRODUODENOSCOPY  01/04/2005  . EYE SURGERY    . PARS PLANA VITRECTOMY Left 10/20/2015   Procedure: PARS PLANA VITRECTOMY WITH 25 GAUGE, endolaser, gas exchange SF6 20%;  Surgeon: Milus Height, MD;  Location: [facility] ORS;  Service: Ophthalmology;  Laterality: Left;  . TUBAL LIGATION      Current Medications: Current Outpatient Prescriptions  Medication Sig Dispense Refill  . aspirin 81 MG tablet Take 81 mg by mouth daily.     . Calcium Carbonate-Vitamin D 600-400 MG-UNIT per tablet Take 1 tablet by  mouth daily.    . citalopram (CELEXA) 10 MG tablet Take 0.5 tablets (5 mg total) by mouth daily. 45 tablet 3  . [name] SURECLICK 50 MG/ML injection Inject 50 mg as directed once a week.   2  . fenofibrate 160 MG tablet TAKE 1 TABLET BY MOUTH DAILY WITH FOOD 90 tablet 3  . gabapentin (NEURONTIN) 100 MG capsule Take 100 mg by mouth 3 (three) times daily as needed (nerve pain).    Marland Kitchen glucose blood (TRUE METRIX BLOOD GLUCOSE TEST) test strip Test blood sugar daily 100 each 12  . Lancets MISC Use to test blood sugar daily 100  each 3  . Multiple Vitamins-Minerals (MULTIVITAMIN) tablet Take 1 tablet by mouth daily.    . Omega-3 Fatty Acids (FISH OIL) 1000 MG CAPS Take 1 capsule by mouth daily.      Marland Kitchen omeprazole (PRILOSEC) 40 MG capsule TAKE 1 CAPSULE BY MOUTH TWICE DAILY. REPLACES PANTOPRAZOLE. 180 capsule 3  . simvastatin (ZOCOR) 10 MG tablet TAKE 1 TABLET BY MOUTH EVERY NIGHT AT BEDTIME 90 tablet 0   No current facility-administered medications for this visit.      Allergies:   Demerol [meperidine]; Penicillins; and Meperidine hcl   Social History   Social History  . Marital status: Married    Spouse name: N/A  . Number of children: N/A  . Years of education: N/A   Social History Main Topics  . Smoking status: Never Smoker  . Smokeless tobacco: Never Used  . Alcohol use No  . Drug use: No  . Sexual activity: Yes     Comment: monagomous    Other Topics Concern  . None   Social History Narrative  . None     Family History:  Family History  Problem Relation Age of Onset  . COPD Other   . Hypertension Other   . Diabetes Other   . Uterine cancer Sister     ROS:   Please see the history of present illness.  All other systems are reviewed and otherwise negative.    PHYSICAL EXAM:   VS:  BP 114/72 (BP Location: Left Arm)   Pulse 75   Ht 5\' 6"  (1.676 m)   Wt 190 lb 9.6 oz (86.5 kg)   BMI 30.76 kg/m   BMI: Body mass index is 30.76 kg/m. GEN: Well nourished, well developed WF, in no acute distress  HEENT: normocephalic, atraumatic Neck: no JVD, carotid bruits, or masses Cardiac: RRR; no murmurs, rubs, or gallops, no edema  Respiratory:  clear to auscultation bilaterally, normal work of breathing GI: soft, nontender, nondistended, + BS MS: no deformity or atrophy  Skin: warm and dry, no rash Neuro:  Alert and Oriented x 3, Strength and sensation are intact, follows commands Psych: euthymic mood, full affect  Wt Readings from Last 3 Encounters:  02/19/17 190 lb 9.6 oz (86.5 kg)    07/04/16 185 lb (83.9 kg)  06/19/16 185 lb (83.9 kg)      Studies/Labs Reviewed:   EKG:  EKG was ordered today and personally reviewed by me and demonstrates NSR 75bpm with sinus arrhythmia, otherwise no specific changes  Recent Labs: No results found for requested labs within last 8760 hours.   Lipid Panel    Component Value Date/Time   CHOL 151 02/26/2015 0916   CHOL 168 06/05/2013   TRIG 98 02/26/2015 0916   TRIG 118 06/05/2013   HDL 42 (L) 02/26/2015 0916   CHOLHDL 3.6 02/26/2015 0916   VLDL  20 02/26/2015 0916   LDLCALC 89 02/26/2015 0916   LDLCALC 103 06/05/2013   LDLDIRECT 109 (H) 01/19/2011 2022    Additional studies/ records that were reviewed today include: Summarized above.    ASSESSMENT & PLAN:   1. Precordial chest pain/back discomfort - generally atypical symptoms for cardiac etiology. EKG unremarkable. Currently asymptomatic. Given cardiac risk factors will arrange ETT. If this is normal, symptoms may be due to constellation of aches related to her known arthritis. ER precautions reviewed. Continue baby aspirin. Also reviewed that essential oil use should only be via carrier oil and even at that she should discuss specific formulations with her PCP to make sure no drug or comorbidity interaction. 2. Pre-diabetes - the patient reports this has resolved. Further monitoring per PCP. 3. Diastolic dysfunction without heart failure - no evidence of CHF at this time. Continue observation of BP control. 4. Dyslipidemia - followed by GYN/PCP. 5. Obesity - if ETT is normal, would encourage participation in regular exercise program. She expresses the knowledge that she would like to get back into regular walking program as before.  Disposition: F/u tentatively with me after her ETT. If this is normal, may cancel this appointment and f/u 1 year instead.   Medication Adjustments/Labs and Tests Ordered: Current medicines are reviewed at length with the patient today.   Concerns regarding medicines are outlined above. Medication changes, Labs and Tests ordered today are summarized above and listed in the Patient Instructions accessible in Encounters.   Raechel Ache PA-C  02/19/2017 2:20 PM    [facility] [place], [place], [place]  [address] Phone: [phone]; Fax: [phone]

## 2017-02-21 ENCOUNTER — Ambulatory Visit (INDEPENDENT_AMBULATORY_CARE_PROVIDER_SITE_OTHER): Payer: BLUE CROSS/BLUE SHIELD

## 2017-02-21 DIAGNOSIS — R072 Precordial pain: Secondary | ICD-10-CM | POA: Diagnosis not present

## 2017-02-21 LAB — EXERCISE TOLERANCE TEST
CHL CUP RESTING HR STRESS: 75 {beats}/min
CHL RATE OF PERCEIVED EXERTION: 19
CSEPED: 9 min
CSEPEW: 10.1 METS
Exercise duration (sec): 0 s
MPHR: 163 {beats}/min
Peak HR: 164 {beats}/min
Percent HR: 100 %

## 2017-02-22 ENCOUNTER — Telehealth: Payer: Self-pay | Admitting: *Deleted

## 2017-02-22 DIAGNOSIS — R072 Precordial pain: Secondary | ICD-10-CM

## 2017-02-22 NOTE — Telephone Encounter (Signed)
-----   Message from Charlie Pitter, [place] sent at 02/21/2017  5:04 PM EST ----- Please call patient. Her stress test came back equivocal - meaning it did not answer the question definitively. The report came back as "positive," however, I reviewed images with Dr. Radford Pax who also reviewed with Dr. Lovena Le who feels there are only nonspecific changes, nothing to definitively suggest ischemia. (There is only 0.61mm upsloping ST depression rather than true 31mm ST depression as reported out.) Also of note she had no angina and exercised for 9 minutes. Given atypical nature of pain we would recommend further evaluation with an exercise nuclear stress test. The exercise combined with the images should give [place] a better picture of her heart arteries.   Dayna Dunn PA-C

## 2017-02-26 ENCOUNTER — Telehealth (HOSPITAL_COMMUNITY): Payer: Self-pay | Admitting: *Deleted

## 2017-02-26 NOTE — Telephone Encounter (Signed)
Patient given detailed instructions per Myocardial Perfusion Study Information Sheet for the test on 02/28/17 at 0945. Patient notified to arrive 15 minutes early and that it is imperative to arrive on time for appointment to keep from having the test rescheduled.  If you need to cancel or reschedule your appointment, please call the office within 24 hours of your appointment. Failure to do so may result in a cancellation of your appointment, and a $50 no show fee. Patient verbalized understanding.Michele Carter, [place]e [place]

## 2017-02-28 ENCOUNTER — Ambulatory Visit (HOSPITAL_COMMUNITY): Payer: BLUE CROSS/BLUE SHIELD | Attending: Cardiovascular Disease

## 2017-02-28 DIAGNOSIS — R072 Precordial pain: Secondary | ICD-10-CM

## 2017-02-28 MED ORDER — TECHNETIUM TC 99M TETROFOSMIN IV KIT
33.0000 | PACK | Freq: Once | INTRAVENOUS | Status: AC | PRN
  Administered 2017-02-28: 33 via INTRAVENOUS
  Filled 2017-02-28: qty 33

## 2017-03-01 ENCOUNTER — Ambulatory Visit (HOSPITAL_COMMUNITY): Payer: BLUE CROSS/BLUE SHIELD | Attending: Cardiovascular Disease

## 2017-03-01 LAB — MYOCARDIAL PERFUSION IMAGING
CHL CUP NUCLEAR SDS: 4
CHL CUP RESTING HR STRESS: 72 {beats}/min
CSEPEDS: 0 s
CSEPHR: 99 %
Estimated workload: 10.1 METS
Exercise duration (min): 9 min
LHR: 0.4
LVDIAVOL: 56 mL (ref 46–106)
LVSYSVOL: 15 mL
MPHR: 139 {beats}/min
NUC STRESS TID: 0.87
Peak HR: 162 {beats}/min
SRS: 5
SSS: 9

## 2017-03-01 MED ORDER — TECHNETIUM TC 99M TETROFOSMIN IV KIT
32.6000 | PACK | Freq: Once | INTRAVENOUS | Status: AC | PRN
  Administered 2017-03-01: 32.6 via INTRAVENOUS
  Filled 2017-03-01: qty 33

## 2017-03-09 ENCOUNTER — Encounter: Payer: Self-pay | Admitting: Physician Assistant

## 2017-03-22 ENCOUNTER — Ambulatory Visit (INDEPENDENT_AMBULATORY_CARE_PROVIDER_SITE_OTHER): Payer: BLUE CROSS/BLUE SHIELD | Admitting: Physician Assistant

## 2017-03-22 ENCOUNTER — Encounter: Payer: Self-pay | Admitting: Physician Assistant

## 2017-03-22 VITALS — BP 114/70 | HR 82 | Ht 66.0 in | Wt 191.0 lb

## 2017-03-22 DIAGNOSIS — R079 Chest pain, unspecified: Secondary | ICD-10-CM

## 2017-03-22 DIAGNOSIS — R7303 Prediabetes: Secondary | ICD-10-CM | POA: Diagnosis not present

## 2017-03-22 DIAGNOSIS — E785 Hyperlipidemia, unspecified: Secondary | ICD-10-CM

## 2017-03-22 DIAGNOSIS — Z6830 Body mass index (BMI) 30.0-30.9, adult: Secondary | ICD-10-CM

## 2017-03-22 DIAGNOSIS — E6609 Other obesity due to excess calories: Secondary | ICD-10-CM | POA: Diagnosis not present

## 2017-03-22 NOTE — Patient Instructions (Signed)
Medication Instructions:  None ordered  Labwork: None ordered  Testing/Procedures: None ordered  Follow-Up: Your physician wants you to follow-up in: [place] will receive a reminder letter in the mail two months in advance. If you don't receive a letter, please call our office to schedule the follow-up appointment.   Any Other Special Instructions Will Be Listed Below (If Applicable).     If you need a refill on your cardiac medications before your next appointment, please call your pharmacy.

## 2017-03-22 NOTE — Progress Notes (Signed)
Cardiology Office Note    Date:  03/22/2017  ID:  Michele Carter, DOB Apr 08, 1961, MRN [id number] PCP:  Zigmund Gottron, MD  Cardiologist:  Dr. Radford Pax   Chief Complaint: f/u stress test  History of Present Illness:  Michele Carter is a 56 y.o. female with history of pre-diabetes, dyslipidemia, IBS, anxiety, TIA, psoriatic arthritis who presents for evaluation of chest pain. She was seen by Dr. Radford Pax remotely in 2016 for chest pain in the setting of lots of stress at work. 2D echo 01/22/15: EF 55-60%, no RWMA, grade 1 DD. ETT 01/22/15 was nonischemic, occasional PVCs in recovery. Last labs in 2016: LDL 89, glucose 145, Cr 0.74, Hgb 13.6. She has since retired as of 03/2015.  She was seen in clinic 02/19/17 for episodes of atypical chest pain, see that note for comprehensive details. The symptoms resolved without intervention. No family history of CAD. Her cholesterol/blood sugar are managed by her GYN with labs faxed to PCP for review. ETT 02/21/17 was interpreted by the interpreting physician as abnormal but there were only borderline ST-T changes; I had reviewed this with Dr. Radford Pax who reviewed with Dr. Lovena Le who agreed it was equivocal, not a true positive. Subsequent nuclear stress test 02/28/17 was normal.  She returns for follow-up with her husband. She has not had any recurrent chest pain or shoulder pain. No dyspnea, dizziness, palpitations or syncope. She wonders if her history of hiatal hernia contributed to her symptoms.  Past Medical History:  Diagnosis Date  . Acid reflux   . Anxiety   . Arthritis    PSORIATIC  . Chest pain    a. equivocal ETT 02/2017 -> subsequent normal nuc.  . Diastolic dysfunction without heart failure   . History of hiatal hernia   . Hyperlipidemia   . IBS (irritable bowel syndrome)   . Psoriatic arthritis ([place])   . TIA (transient ischemic attack) 1995    Past Surgical History:  Procedure Laterality Date  . BACK SURGERY    . CATARACT EXTRACTION  W/PHACO Left 06/19/2016   Procedure: CATARACT EXTRACTION PHACO AND INTRAOCULAR LENS PLACEMENT (IOC);  Surgeon: Birder Robson, MD;  Location: [facility];  Service: Ophthalmology;  Laterality: Left;  [place] 53.7 AP% 19.6 CDE 10.57 Fluid Lot # [id number]  . CATARACT EXTRACTION W/PHACO Right 07/04/2016   Procedure: CATARACT EXTRACTION PHACO AND INTRAOCULAR LENS PLACEMENT ([place]);  Surgeon: Birder Robson, MD;  Location: [facility];  Service: Ophthalmology;  Laterality: Right;  [place] 00:52 AP% 10.6 CDE 5.56 fluid pack lot # [id number]  . CHOLECYSTECTOMY    . COLONOSCOPY  10/05/2010   Normal--Multiple   . ENDARTERECTOMY    . ESOPHAGOGASTRODUODENOSCOPY  01/04/2005  . EYE SURGERY    . PARS PLANA VITRECTOMY Left 10/20/2015   Procedure: PARS PLANA VITRECTOMY WITH 25 GAUGE, endolaser, gas exchange SF6 20%;  Surgeon: Milus Height, MD;  Location: [facility] OR[facility];  Service: Ophthalmology;  Laterality: Left;  . TUBAL LIGATION      Current Medications: Current Outpatient Prescriptions  Medication Sig Dispense Refill  . aspirin 81 MG tablet Take 81 mg by mouth daily.     . Calcium Carbonate-Vitamin D 600-400 MG-UNIT per tablet Take 1 tablet by mouth daily.    . citalopram (CELEXA) 10 MG tablet Take 0.5 tablets (5 mg total) by mouth daily. 45 tablet 3  . [name] SURECLICK 50 MG/ML injection Inject 50 mg as directed once a week.   2  . fenofibrate 160 MG tablet TAKE 1 TABLET BY MOUTH  DAILY WITH FOOD 90 tablet 3  . gabapentin (NEURONTIN) 100 MG capsule Take 100 mg by mouth as needed (nerve pain).     . Multiple Vitamins-Minerals (MULTIVITAMIN) tablet Take 1 tablet by mouth daily.    . Omega-3 Fatty Acids (FISH OIL) 1000 MG CAPS Take 1 capsule by mouth daily.      Marland Kitchen omeprazole (PRILOSEC) 40 MG capsule TAKE 1 CAPSULE BY MOUTH TWICE DAILY. REPLACES PANTOPRAZOLE. 180 capsule 3  . simvastatin (ZOCOR) 10 MG tablet TAKE 1 TABLET BY MOUTH EVERY NIGHT AT BEDTIME 90 tablet 0   No current facility-administered medications for this  visit.      Allergies:   Demerol [meperidine]; Penicillins; and Meperidine hcl   Social History   Social History  . Marital status: Married    Spouse name: Michele Carter  . Number of children: Michele Carter  . Years of education: Michele Carter   Social History Main Topics  . Smoking status: Never Smoker  . Smokeless tobacco: Never Used  . Alcohol use No  . Drug use: No  . Sexual activity: Yes     Comment: monagomous    Other Topics Concern  . None   Social History Narrative  . None     Family History:  Family History  Problem Relation Age of Onset  . COPD Other   . Hypertension Other   . Diabetes Other   . Uterine cancer Sister     ROS:   Please see the history of present illness.  All other systems are reviewed and otherwise negative.    PHYSICAL EXAM:   VS:  BP 114/70   Pulse 82   Ht 5\' 6"  (1.676 m)   Wt 191 lb (86.6 kg)   BMI 30.83 kg/m   BMI: Body mass index is 30.83 kg/m. GEN: Well nourished, well developed WF, in no acute distress  HEENT: normocephalic, atraumatic Neck: no JVD, carotid bruits, or masses Cardiac: RRR; no murmurs, rubs, or gallops, no edema  Respiratory:  clear to auscultation bilaterally, normal work of breathing GI: soft, nontender, nondistended, + BS MS: no deformity or atrophy  Skin: warm and dry, no rash Neuro:  Alert and Oriented x 3, Strength and sensation are intact, follows commands Psych: euthymic mood, full affect  Wt Readings from Last 3 Encounters:  03/22/17 191 lb (86.6 kg)  02/28/17 190 lb (86.2 kg)  02/19/17 190 lb 9.6 oz (86.5 kg)      Studies/Labs Reviewed:   EKG:  EKG was ordered today and personally reviewed by me and demonstrates   Recent Labs: No results found for requested labs within last 8760 hours.   Lipid Panel    Component Value Date/Time   CHOL 151 02/26/2015 0916   CHOL 168 06/05/2013   TRIG 98 02/26/2015 0916   TRIG 118 06/05/2013   HDL 42 (L) 02/26/2015 0916   CHOLHDL 3.6 02/26/2015 0916   VLDL 20 02/26/2015  0916   LDLCALC 89 02/26/2015 0916   LDLCALC 103 06/05/2013   LDLDIRECT 109 (H) 01/19/2011 2022    Additional studies/ records that were reviewed today include: Summarized above.    ASSESSMENT & PLAN:   1. Chest pain - atypical, negative nuclear stress test. Reviewed importance of surveillance for recurrent or escalating symptoms. Continue risk factor modification. 2. Pre-diabetes - discussed importance of keeping good blood sugar control to reduced risk of CV disease. 3. Dyslipidemia - followed by PCP. 4. Obesity - I have recommended she participate in regular physical activity.  Disposition:  F/u with Dr. Radford Pax in 1 year.   Medication Adjustments/Labs and Tests Ordered: Current medicines are reviewed at length with the patient today.  Concerns regarding medicines are outlined above. Medication changes, Labs and Tests ordered today are summarized above and listed in the Patient Instructions accessible in Encounters.   Raechel Ache PA-C  03/22/2017 3:37 PM    [facility] [place], [place], [place]  [address] Phone: [phone]; Fax: [phone]

## 2017-04-06 ENCOUNTER — Other Ambulatory Visit: Payer: Self-pay | Admitting: Family Medicine

## 2017-05-01 ENCOUNTER — Other Ambulatory Visit: Payer: Self-pay | Admitting: Obstetrics and Gynecology

## 2017-05-01 DIAGNOSIS — Z1231 Encounter for screening mammogram for malignant neoplasm of breast: Secondary | ICD-10-CM

## 2017-05-09 ENCOUNTER — Other Ambulatory Visit: Payer: Self-pay | Admitting: Family Medicine

## 2017-05-16 ENCOUNTER — Ambulatory Visit
Admission: RE | Admit: 2017-05-16 | Discharge: 2017-05-16 | Disposition: A | Discharge status: Home / Self Care | Payer: BLUE CROSS/BLUE SHIELD | Source: Ambulatory Visit | Attending: Obstetrics and Gynecology | Admitting: Obstetrics and Gynecology

## 2017-05-16 DIAGNOSIS — Z1231 Encounter for screening mammogram for malignant neoplasm of breast: Secondary | ICD-10-CM

## 2017-07-09 ENCOUNTER — Other Ambulatory Visit: Payer: Self-pay | Admitting: Family Medicine

## 2017-07-18 ENCOUNTER — Ambulatory Visit: Admission: RE | Admit: 2017-07-18 | Discharge: 2017-07-18

## 2017-07-18 DIAGNOSIS — D229 Melanocytic nevi, unspecified: Principal | ICD-10-CM

## 2017-07-31 ENCOUNTER — Other Ambulatory Visit: Payer: Self-pay | Admitting: Obstetrics and Gynecology

## 2017-07-31 DIAGNOSIS — E2839 Other primary ovarian failure: Secondary | ICD-10-CM

## 2017-08-15 ENCOUNTER — Ambulatory Visit
Admission: RE | Admit: 2017-08-15 | Discharge: 2017-08-15 | Disposition: A | Discharge status: Home / Self Care | Payer: BLUE CROSS/BLUE SHIELD | Source: Ambulatory Visit | Attending: Obstetrics and Gynecology | Admitting: Obstetrics and Gynecology

## 2017-08-15 DIAGNOSIS — E2839 Other primary ovarian failure: Secondary | ICD-10-CM

## 2017-09-08 ENCOUNTER — Other Ambulatory Visit: Payer: Self-pay | Admitting: Family Medicine

## 2017-10-07 ENCOUNTER — Other Ambulatory Visit: Payer: Self-pay | Admitting: Family Medicine

## 2018-01-07 ENCOUNTER — Other Ambulatory Visit: Payer: Self-pay | Admitting: Family Medicine

## 2018-03-06 ENCOUNTER — Encounter: Payer: Self-pay | Admitting: Family Medicine

## 2018-03-06 ENCOUNTER — Ambulatory Visit (INDEPENDENT_AMBULATORY_CARE_PROVIDER_SITE_OTHER): Payer: BLUE CROSS/BLUE SHIELD | Admitting: Family Medicine

## 2018-03-06 ENCOUNTER — Other Ambulatory Visit: Payer: Self-pay

## 2018-03-06 DIAGNOSIS — E785 Hyperlipidemia, unspecified: Secondary | ICD-10-CM

## 2018-03-06 DIAGNOSIS — K2100 Gastro-esophageal reflux disease with esophagitis, without bleeding: Secondary | ICD-10-CM

## 2018-03-06 DIAGNOSIS — Z1159 Encounter for screening for other viral diseases: Secondary | ICD-10-CM

## 2018-03-06 DIAGNOSIS — R7303 Prediabetes: Secondary | ICD-10-CM

## 2018-03-06 DIAGNOSIS — R739 Hyperglycemia, unspecified: Secondary | ICD-10-CM

## 2018-03-06 DIAGNOSIS — F4323 Adjustment disorder with mixed anxiety and depressed mood: Secondary | ICD-10-CM

## 2018-03-06 MED ORDER — ATORVASTATIN CALCIUM 10 MG PO TABS: 10.0000 mg | ORAL_TABLET | Freq: Every day | ORAL | 3 refills | Status: DC

## 2018-03-06 NOTE — Patient Instructions (Signed)
Lets find out where we can get blood drawn. I will call with results.

## 2018-03-07 ENCOUNTER — Encounter: Payer: Self-pay | Admitting: Family Medicine

## 2018-03-07 NOTE — Assessment & Plan Note (Signed)
Remain on PPI - which also helps with NSAID

## 2018-03-07 NOTE — Assessment & Plan Note (Signed)
-

## 2018-03-07 NOTE — Assessment & Plan Note (Signed)
Screen x 1 

## 2018-03-07 NOTE — Progress Notes (Signed)
   Subjective:    Patient ID: Michele Carter, female    DOB: 10/23/1961, 57 y.o.   MRN: [id number]  HPI FU multiple problems.  Patient has GYN doc who does paps and mammos.  Also has rheum for enbrel.  Issues for me: 1. Hyperlipidemia.  Both high LDL and high triglycerides.  Not fasting.  Needs recheck.  Also, has been on simvastatin which has drug interaction with fibrate.  Discussed if now a good time to switch. 2. Prediabetes.  Wt stable in big picture, has fluctuated in the short run.  Eating health and exercising.   3. HPDP has been screened remotely (~15 years ago) for HIV.  Very low risk.  Declines further screening.  Never screened for Hep C.  Seems low risk but agrees to screeen. 4. Adjustment. Remains on low dose citalopram.  Desires to continue. 5. Remains on PPI.  Desires to continue.    Review of Systems     Objective:   Physical Exam  Lungs clear Cardiac RRR without m or g Abd benign.        Assessment & Plan:

## 2018-03-07 NOTE — Assessment & Plan Note (Signed)
Switch to atorvastatin.  Fasting lipids and TSH.

## 2018-03-07 NOTE — Assessment & Plan Note (Signed)
Screen for DM 

## 2018-03-08 ENCOUNTER — Other Ambulatory Visit: Payer: Self-pay | Admitting: Family Medicine

## 2018-03-08 DIAGNOSIS — E119 Type 2 diabetes mellitus without complications: Secondary | ICD-10-CM

## 2018-03-09 LAB — CMP14+EGFR
ALT: 23 IU/L (ref 0–32)
AST: 22 IU/L (ref 0–40)
Albumin/Globulin Ratio: 1.8 (ref 1.2–2.2)
Albumin: 4.6 g/dL (ref 3.5–5.5)
Alkaline Phosphatase: 87 IU/L (ref 39–117)
BUN/Creatinine Ratio: 19 (ref 9–23)
BUN: 14 mg/dL (ref 6–24)
Bilirubin Total: 0.2 mg/dL (ref 0.0–1.2)
CALCIUM: 10.6 mg/dL — AB (ref 8.7–10.2)
CO2: 22 mmol/L (ref 20–29)
Chloride: 104 mmol/L (ref 96–106)
Creatinine, Ser: 0.74 mg/dL (ref 0.57–1.00)
GFR, EST AFRICAN AMERICAN: 103 mL/min/{1.73_m2} (ref >59)
GFR, EST NON AFRICAN AMERICAN: 90 mL/min/{1.73_m2} (ref >59)
GLUCOSE: 150 mg/dL — AB (ref 65–99)
Globulin, Total: 2.6 g/dL (ref 1.5–4.5)
Potassium: 4.8 mmol/L (ref 3.5–5.2)
Sodium: 144 mmol/L (ref 134–144)
TOTAL PROTEIN: 7.2 g/dL (ref 6.0–8.5)

## 2018-03-09 LAB — HEPATITIS C ANTIBODY: Hep C Virus Ab: <0.1 s/co ratio (ref 0.0–0.9)

## 2018-03-09 LAB — LIPID PANEL
CHOL/HDL RATIO: 4.4 ratio (ref 0.0–4.4)
Cholesterol, Total: 159 mg/dL (ref 100–199)
HDL: 36 mg/dL — AB (ref >39)
LDL Calculated: 83 mg/dL (ref 0–99)
Triglycerides: 199 mg/dL — ABNORMAL HIGH (ref 0–149)
VLDL Cholesterol Cal: 40 mg/dL (ref 5–40)

## 2018-03-09 LAB — HGB A1C W/O EAG: HEMOGLOBIN A1C: 7.4 % — AB (ref 4.8–5.6)

## 2018-03-09 LAB — TSH: TSH: 1.84 u[IU]/mL (ref 0.450–4.500)

## 2018-03-11 NOTE — Assessment & Plan Note (Signed)
Ca++ high again.  Will repeat level before pursuing work up.  Last time 06/2014, I did PTH and repeat calcium, which was normal at the time of repeat. Will repeat at 3 month check.

## 2018-03-11 NOTE — Assessment & Plan Note (Signed)
Patient has officially crossed the line between prediabetes to diabetes.  She will work on diet and exercise and follow up in 3 months.  If A1C still elevated, will begin meds at that time.

## 2018-03-12 ENCOUNTER — Encounter: Payer: Self-pay | Admitting: Family Medicine

## 2018-03-19 ENCOUNTER — Telehealth: Payer: Self-pay

## 2018-03-19 NOTE — Telephone Encounter (Signed)
Patient left message on nurse line that she would like to have her labwork orders sent to her or faxed so she can get it done closer to her home. Danley Danker, RN [facility] RN)  Call back [phone]

## 2018-03-20 NOTE — Telephone Encounter (Signed)
Spoke to pt. She would like to go to [facility] in [place]. I called [facility] in [place]. Spoke to [place]. As long as the orders are put in to be released to [facility] they can get the orders. I looked at the order. It is set for [facility] so pt can go there and have the blood drawn. Called pt back and informed her she can go to [facility] for blood draw. Ottis Stain, CMA

## 2018-03-22 ENCOUNTER — Other Ambulatory Visit: Payer: Self-pay | Admitting: Family Medicine

## 2018-03-23 LAB — CALCIUM: Calcium: 9.8 mg/dL (ref 8.7–10.2)

## 2018-03-28 IMAGING — NM NM MISC PROCEDURE
6 series · 36 of 36 positions shown · non-contrast
Comparison: none

[Series 1: stress · 6.51mm/px · 6 of 62 frames shown (1 of 2)]
[frame 6/62]
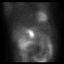
[frame 16/62]
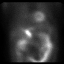
[frame 26/62]
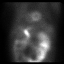
[frame 37/62]
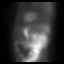
[frame 47/62]
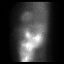
[frame 57/62]
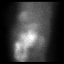

[Series 1: stress · 6.51mm/px · 6 of 511 frames shown (2 of 2)]
[frame 43/511  full-range]
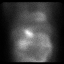
[frame 128/511  full-range]
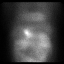
[frame 213/511  full-range]
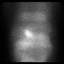
[frame 298/511  full-range]
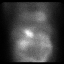
[frame 383/511  full-range]
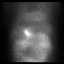
[frame 469/511  full-range]
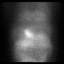

[Series 1: wbr_s-proj_st stress · 6.51mm/px · 6 of 512 frames shown (1 of 2)]
[frame 43/512]
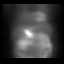
[frame 128/512]
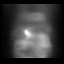
[frame 214/512]
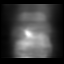
[frame 299/512]
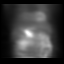
[frame 384/512]
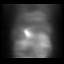
[frame 470/512]
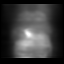

[Series 1: wbr_s-proj_st stress · 6.51mm/px · 6 of 64 frames shown (2 of 2)]
[frame 6/64]
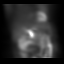
[frame 16/64]
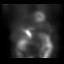
[frame 27/64]
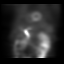
[frame 38/64]
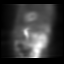
[frame 48/64]
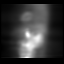
[frame 59/64]
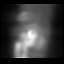

[Series 2: wbr_r-proj_st rest · 6.51mm/px · 6 of 64 frames shown]
[frame 6/64]
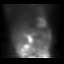
[frame 16/64]
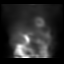
[frame 27/64]
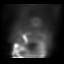
[frame 38/64]
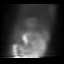
[frame 48/64]
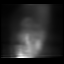
[frame 59/64]
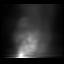

[Series 2: rest · 6.51mm/px · 6 of 64 frames shown]
[frame 6/64]
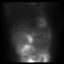
[frame 16/64]
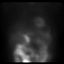
[frame 27/64]
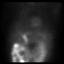
[frame 38/64]
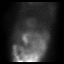
[frame 48/64]
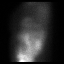
[frame 59/64]
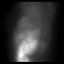

[36 of 36 positions shown; findings below may reference images not displayed]

Canned report from images found in remote index.

Refer to host system for actual result text.

## 2018-04-03 ENCOUNTER — Other Ambulatory Visit: Payer: Self-pay | Admitting: Obstetrics and Gynecology

## 2018-04-03 DIAGNOSIS — Z139 Encounter for screening, unspecified: Secondary | ICD-10-CM

## 2018-04-04 ENCOUNTER — Other Ambulatory Visit: Payer: Self-pay | Admitting: Family Medicine

## 2018-04-04 DIAGNOSIS — E785 Hyperlipidemia, unspecified: Secondary | ICD-10-CM

## 2018-04-17 ENCOUNTER — Telehealth: Payer: Self-pay | Admitting: Family Medicine

## 2018-04-17 ENCOUNTER — Encounter: Payer: Self-pay | Admitting: Family Medicine

## 2018-04-17 DIAGNOSIS — M47899 Other spondylosis, site unspecified: Secondary | ICD-10-CM

## 2018-04-17 DIAGNOSIS — E119 Type 2 diabetes mellitus without complications: Secondary | ICD-10-CM

## 2018-04-17 NOTE — Telephone Encounter (Signed)
Pt called and would like the doctor to put in her lab orders so that she can go to [facility] in [place].jw

## 2018-04-17 NOTE — Telephone Encounter (Signed)
I called and LM.  I believe that all she needs is a Hgb A1C.  We can do that as a point of care.   Needs CBC and CMP every 3-6 months while on [name].  Last labs 03/08/18

## 2018-04-30 ENCOUNTER — Other Ambulatory Visit: Payer: Self-pay | Admitting: Family Medicine

## 2018-04-30 DIAGNOSIS — K2100 Gastro-esophageal reflux disease with esophagitis, without bleeding: Secondary | ICD-10-CM

## 2018-05-16 ENCOUNTER — Encounter: Payer: Self-pay | Admitting: Family Medicine

## 2018-05-16 ENCOUNTER — Other Ambulatory Visit: Payer: Self-pay | Admitting: *Deleted

## 2018-05-16 DIAGNOSIS — M47899 Other spondylosis, site unspecified: Secondary | ICD-10-CM

## 2018-05-16 DIAGNOSIS — E119 Type 2 diabetes mellitus without complications: Secondary | ICD-10-CM

## 2018-05-20 ENCOUNTER — Ambulatory Visit
Admission: RE | Admit: 2018-05-20 | Discharge: 2018-05-20 | Disposition: A | Discharge status: Home / Self Care | Payer: BLUE CROSS/BLUE SHIELD | Source: Ambulatory Visit | Attending: Obstetrics and Gynecology | Admitting: Obstetrics and Gynecology

## 2018-05-20 DIAGNOSIS — Z139 Encounter for screening, unspecified: Secondary | ICD-10-CM

## 2018-05-23 ENCOUNTER — Other Ambulatory Visit: Payer: Self-pay | Admitting: Family Medicine

## 2018-05-24 LAB — CMP14+EGFR
ALT: 25 IU/L (ref 0–32)
AST: 24 IU/L (ref 0–40)
Albumin/Globulin Ratio: 1.8 (ref 1.2–2.2)
Albumin: 4.7 g/dL (ref 3.5–5.5)
Alkaline Phosphatase: 73 IU/L (ref 39–117)
BUN/Creatinine Ratio: 24 — ABNORMAL HIGH (ref 9–23)
BUN: 17 mg/dL (ref 6–24)
Bilirubin Total: 0.4 mg/dL (ref 0.0–1.2)
CALCIUM: 10.9 mg/dL — AB (ref 8.7–10.2)
CO2: 25 mmol/L (ref 20–29)
CREATININE: 0.72 mg/dL (ref 0.57–1.00)
Chloride: 104 mmol/L (ref 96–106)
GFR calc Af Amer: 107 mL/min/{1.73_m2} (ref >59)
GFR, EST NON AFRICAN AMERICAN: 93 mL/min/{1.73_m2} (ref >59)
GLOBULIN, TOTAL: 2.6 g/dL (ref 1.5–4.5)
GLUCOSE: 146 mg/dL — AB (ref 65–99)
Potassium: 4.8 mmol/L (ref 3.5–5.2)
SODIUM: 144 mmol/L (ref 134–144)
Total Protein: 7.3 g/dL (ref 6.0–8.5)

## 2018-05-24 LAB — CBC
HEMOGLOBIN: 15 g/dL (ref 11.1–15.9)
Hematocrit: 45 % (ref 34.0–46.6)
MCH: 30.1 pg (ref 26.6–33.0)
MCHC: 33.3 g/dL (ref 31.5–35.7)
MCV: 90 fL (ref 79–97)
Platelets: 315 10*3/uL (ref 150–450)
RBC: 4.99 x10E6/uL (ref 3.77–5.28)
RDW: 13.3 % (ref 12.3–15.4)
WBC: 6.8 10*3/uL (ref 3.4–10.8)

## 2018-05-24 LAB — HEMOGLOBIN A1C
Est. average glucose Bld gHb Est-mCnc: 151 mg/dL
Hgb A1c MFr Bld: 6.9 % — ABNORMAL HIGH (ref 4.8–5.6)

## 2018-05-29 ENCOUNTER — Ambulatory Visit: Payer: BLUE CROSS/BLUE SHIELD | Admitting: Family Medicine

## 2018-06-16 ENCOUNTER — Other Ambulatory Visit: Payer: Self-pay | Admitting: Family Medicine

## 2018-07-25 ENCOUNTER — Telehealth: Payer: Self-pay | Admitting: *Deleted

## 2018-07-25 ENCOUNTER — Other Ambulatory Visit: Payer: Self-pay | Admitting: Family Medicine

## 2018-07-25 DIAGNOSIS — R42 Dizziness and giddiness: Secondary | ICD-10-CM

## 2018-07-25 DIAGNOSIS — E119 Type 2 diabetes mellitus without complications: Secondary | ICD-10-CM

## 2018-07-25 MED ORDER — FREESTYLE SYSTEM KIT: PACK | 0 refills | Status: AC

## 2018-07-25 NOTE — Progress Notes (Signed)
I called patient and she endorsed that she felt sick after exercise and had felt funny since then intermittently. She stated that her PCP usually place lab order on [facility] and she gets it done in [place].  I asked that she come in to be seen but she declined. I recommend urgent care visit if she feels sick. In the mean time, I will place order in for Cmet, CBC and TSH. I will also send glucometer kit to her pharmacy for CBG monitoring. F/U with PCP soon.

## 2018-07-25 NOTE — Telephone Encounter (Signed)
I called patient and she endorsed that she felt sick after exercise and had felt funny since then intermittently. She stated that her PCP usually place lab order on EP[facility] and she gets it done in [place].  I asked that she come in to be seen but she declined. I recommend urgent care visit if she feels sick. In the mean time, I will place order in for Cmet, CBC and TSH. I will also send glucometer kit to her pharmacy for CBG monitoring. F/U with PCP soon.

## 2018-07-25 NOTE — Telephone Encounter (Signed)
Patient calling to state she has not been feeling well.  She had worked out same as before, but felt dizzy afterwards.  Ate a piece of candy, laid down, and started feeling better.  Was recently diagnosed with DM and does not have glucometer to check her CBGs.  Wants to know if we could order testing at [facility] since she lives in [place].  She will make followup appt with Dr. Andria Frames when he returns.  Will route to preceptor who is covering for Dr. Andria Frames and call patient back.  Burna Forts, BSN, RN-BC

## 2018-07-26 ENCOUNTER — Telehealth: Payer: Self-pay | Admitting: *Deleted

## 2018-07-26 ENCOUNTER — Other Ambulatory Visit: Payer: Self-pay | Admitting: Family Medicine

## 2018-07-26 NOTE — Telephone Encounter (Signed)
Michele Carter from [facility] needs clarification about the meter.  The original says (freeestyle) so she wonders if you are talking about the freestyle libre.  If so she would needs a script for the sensors as well. Krystalyn Kubota, Salome Spotted, CMA

## 2018-07-26 NOTE — Telephone Encounter (Signed)
Clarified.

## 2018-07-26 NOTE — Progress Notes (Signed)
Orders printed and placed in to be faxed pile. Jazmin Hartsell,CMA

## 2018-07-26 NOTE — Progress Notes (Signed)
Patient called and left message. Stated the lab orders need to be faxed to [facility] in [place]. Fax number is [phone].  Call back is [phone]  Danley Danker, RN [facility] [place])

## 2018-07-27 LAB — COMPREHENSIVE METABOLIC PANEL
A/G RATIO: 1.7 (ref 1.2–2.2)
ALBUMIN: 4.7 g/dL (ref 3.5–5.5)
ALT: 22 IU/L (ref 0–32)
AST: 23 IU/L (ref 0–40)
Alkaline Phosphatase: 63 IU/L (ref 39–117)
BUN / CREAT RATIO: 16 (ref 9–23)
BUN: 14 mg/dL (ref 6–24)
Bilirubin Total: 0.3 mg/dL (ref 0.0–1.2)
CALCIUM: 10.4 mg/dL — AB (ref 8.7–10.2)
CHLORIDE: 105 mmol/L (ref 96–106)
CO2: 22 mmol/L (ref 20–29)
Creatinine, Ser: 0.85 mg/dL (ref 0.57–1.00)
GFR, EST AFRICAN AMERICAN: 87 mL/min/{1.73_m2} (ref >59)
GFR, EST NON AFRICAN AMERICAN: 75 mL/min/{1.73_m2} (ref >59)
Globulin, Total: 2.7 g/dL (ref 1.5–4.5)
Glucose: 117 mg/dL — ABNORMAL HIGH (ref 65–99)
Potassium: 4.9 mmol/L (ref 3.5–5.2)
Sodium: 143 mmol/L (ref 134–144)
TOTAL PROTEIN: 7.4 g/dL (ref 6.0–8.5)

## 2018-07-27 LAB — TSH: TSH: 1.85 u[IU]/mL (ref 0.450–4.500)

## 2018-07-27 LAB — CBC
HEMATOCRIT: 43.1 % (ref 34.0–46.6)
HEMOGLOBIN: 14 g/dL (ref 11.1–15.9)
MCH: 30.1 pg (ref 26.6–33.0)
MCHC: 32.5 g/dL (ref 31.5–35.7)
MCV: 93 fL (ref 79–97)
Platelets: 301 10*3/uL (ref 150–450)
RBC: 4.65 x10E6/uL (ref 3.77–5.28)
RDW: 12.3 % (ref 12.3–15.4)
WBC: 7.9 10*3/uL (ref 3.4–10.8)

## 2018-07-29 NOTE — Telephone Encounter (Signed)
Called and gave lab test results.  She is feeling better.  No further workup unless symptoms recur or worsen.

## 2018-10-24 LAB — HM DIABETES EYE EXAM

## 2018-10-27 ENCOUNTER — Other Ambulatory Visit: Payer: Self-pay | Admitting: Family Medicine

## 2018-12-27 ENCOUNTER — Encounter: Payer: Self-pay | Admitting: Family Medicine

## 2019-02-14 ENCOUNTER — Other Ambulatory Visit: Payer: Self-pay | Admitting: Obstetrics and Gynecology

## 2019-02-14 DIAGNOSIS — Z1231 Encounter for screening mammogram for malignant neoplasm of breast: Secondary | ICD-10-CM

## 2019-02-23 ENCOUNTER — Other Ambulatory Visit: Payer: Self-pay | Admitting: Family Medicine

## 2019-02-23 DIAGNOSIS — E785 Hyperlipidemia, unspecified: Secondary | ICD-10-CM

## 2019-02-26 ENCOUNTER — Ambulatory Visit: Payer: BLUE CROSS/BLUE SHIELD | Admitting: Family Medicine

## 2019-02-26 ENCOUNTER — Encounter: Payer: Self-pay | Admitting: Family Medicine

## 2019-02-26 ENCOUNTER — Other Ambulatory Visit: Payer: Self-pay

## 2019-02-26 VITALS — BP 118/72 | HR 76 | Temp 97.6°F | Ht 66.0 in | Wt 179.5 lb

## 2019-02-26 DIAGNOSIS — E119 Type 2 diabetes mellitus without complications: Secondary | ICD-10-CM | POA: Diagnosis not present

## 2019-02-26 LAB — POCT UA - MICROALBUMIN
Creatinine, POC: 50 mg/dL
Microalbumin Ur, POC: 10 mg/L

## 2019-02-26 LAB — POCT GLYCOSYLATED HEMOGLOBIN (HGB A1C): HbA1c, POC (controlled diabetic range): 8.9 % — AB (ref 0.0–7.0)

## 2019-02-26 MED ORDER — ZOSTER VAC RECOMB ADJUVANTED 50 MCG/0.5ML IM SUSR: 0.5000 mL | Freq: Once | INTRAMUSCULAR | 1 refills | Status: AC

## 2019-02-26 MED ORDER — METFORMIN HCL ER 500 MG PO TB24: ORAL_TABLET | ORAL | 2 refills | Status: DC

## 2019-02-26 NOTE — Patient Instructions (Signed)
I will call with urine test for diabetes damage. I sent in prescriptions for the metformin and shingles vaccine. Have your gynecologist send me a copy of your pap smear Call me in one month to see if we can go higher on the metformin.

## 2019-02-27 ENCOUNTER — Encounter: Payer: Self-pay | Admitting: Family Medicine

## 2019-02-27 NOTE — Assessment & Plan Note (Signed)
A1C up.  After discussion, she will try low dose metformin xr.  Also continue to work hard on diet and exercise.   Urine microalbumin. Diabetic foot exam done.

## 2019-02-27 NOTE — Progress Notes (Signed)
Established Patient Office Visit  Subjective:  Patient ID: Michele Carter, female    DOB: [birth year]  Age: 58 y.o. MRN: [id number]  CC:  Chief Complaint  Patient presents with  . Hyperglycemia    HPI Michele Carter presents for follow up diabetes.  Dxed with DM ~1 year ago.  Did well with diet control alone for several months.  Recently her blood sugars have been very high.  She is reluctant to go on metformin because she has heard it is "not very good" and likely to aggravate her IBS HPDP has Paps by gyn.  She will ask for records. Got a flu shot in January. Needs urine microalbumin.   Asks about shingles vaccine.  Past Medical History:  Diagnosis Date  . Acid reflux   . Anxiety   . Arthritis    PSORIATIC  . Chest pain    a. equivocal ETT 02/2017 -> subsequent normal nuc.  . Diastolic dysfunction without heart failure   . History of hiatal hernia   . Hyperlipidemia   . IBS (irritable bowel syndrome)   . Psoriatic arthritis ([place])   . TIA (transient ischemic attack) 1995    Past Surgical History:  Procedure Laterality Date  . BACK SURGERY    . CATARACT EXTRACTION W/PHACO Left 06/19/2016   Procedure: CATARACT EXTRACTION PHACO AND INTRAOCULAR LENS PLACEMENT (IOC);  Surgeon: Birder Robson, MD;  Location: [facility] ORS;  Service: Ophthalmology;  Laterality: Left;  [place] 53.7 AP% 19.6 CDE 10.57 Fluid Lot # [id number]  . CATARACT EXTRACTION W/PHACO Right 07/04/2016   Procedure: CATARACT EXTRACTION PHACO AND INTRAOCULAR LENS PLACEMENT ([place]);  Surgeon: Birder Robson, MD;  Location: [facility] ORS;  Service: Ophthalmology;  Laterality: Right;  [place] 00:52 AP% 10.6 CDE 5.56 fluid pack lot # [id number]  . CHOLECYSTECTOMY    . COLONOSCOPY  10/05/2010   Normal--Multiple   . ENDARTERECTOMY    . ESOPHAGOGASTRODUODENOSCOPY  01/04/2005  . EYE SURGERY    . PARS PLANA VITRECTOMY Left 10/20/2015   Procedure: PARS PLANA VITRECTOMY WITH 25 GAUGE, endolaser, gas exchange SF6 20%;  Surgeon: Milus Height, MD;  Location: [facility] ORS;  Service: Ophthalmology;  Laterality: Left;  . TUBAL LIGATION      Family History  Problem Relation Age of Onset  . COPD Other   . Hypertension Other   . Diabetes Other   . Uterine cancer Sister     Social History   Socioeconomic History  . Marital status: Married    Spouse name: Not on file  . Number of children: Not on file  . Years of education: Not on file  . Highest education level: Not on file  Occupational History  . Not on file  Social Needs  . Financial resource strain: Not on file  . Food insecurity:    Worry: Not on file    Inability: Not on file  . Transportation needs:    Medical: Not on file    Non-medical: Not on file  Tobacco Use  . Smoking status: Never Smoker  . Smokeless tobacco: Never Used  Substance and Sexual Activity  . Alcohol use: No  . Drug use: No  . Sexual activity: Yes    Comment: monagomous   Lifestyle  . Physical activity:    Days per week: Not on file    Minutes per session: Not on file  . Stress: Not on file  Relationships  . Social connections:    Talks on phone: Not on file  Gets together: Not on file    Attends religious service: Not on file    Active member of club or organization: Not on file    Attends meetings of clubs or organizations: Not on file    Relationship status: Not on file  . Intimate partner violence:    Fear of current or ex partner: Not on file    Emotionally abused: Not on file    Physically abused: Not on file    Forced sexual activity: Not on file  Other Topics Concern  . Not on file  Social History Narrative  . Not on file    Outpatient Medications Prior to Visit  Medication Sig Dispense Refill  . aspirin 81 MG tablet Take 81 mg by mouth daily.     . atorvastatin (LIPITOR) 10 MG tablet TAKE 1 TABLET(10 MG) BY MOUTH DAILY 90 tablet 3  . Calcium Carbonate-Vitamin D 600-400 MG-UNIT per tablet Take 1 tablet by mouth daily.    . citalopram (CELEXA) 10 MG tablet  TAKE 1/2 TABLET(5 MG) BY MOUTH DAILY 45 tablet 3  . [name] SURECLICK 50 MG/ML injection Inject 50 mg as directed once a week.   2  . fenofibrate 160 MG tablet TAKE 1 TABLET BY MOUTH DAILY WITH FOOD 90 tablet 3  . glucose monitoring kit (FREESTYLE) monitoring kit Use to check capillary glucose TID 1 each 0  . Multiple Vitamins-Minerals (MULTIVITAMIN) tablet Take 1 tablet by mouth daily.    . Omega-3 Fatty Acids (FISH OIL) 1000 MG CAPS Take 1 capsule by mouth daily.      . omeprazole (PRILOSEC) 40 MG capsule TAKE 1 CAPSULE BY MOUTH TWICE DAILY. REPLACES PANTOPRAZOLE. 180 capsule 3   No facility-administered medications prior to visit.     Allergies  Allergen Reactions  . Demerol [Meperidine] Nausea And Vomiting  . Penicillins Other (See Comments)    REACTION: during childhood, unknown reaction Has patient had a PCN reaction causing immediate rash, facial/tongue/throat swelling, SOB or lightheadedness with hypotension: Unknown Has patient had a PCN reaction causing severe rash involving mucus membranes or skin necrosis: Unknown Has patient had a PCN reaction that required hospitalization Unkown Has patient had a PCN reaction occurring within the last 10 years: No If all of the above answers are "NO", then may proceed with Cephalosporin use.  . Meperidine Hcl Other (See Comments)    REACTION: severe GI upset    ROS Review of Systems    Objective:    Physical Exam  BP 118/72   Pulse 76   Temp 97.6 F (36.4 C) (Oral)   Ht 5' 6" (1.676 m)   Wt 179 lb 8 oz (81.4 kg)   SpO2 97%   BMI 28.97 kg/m  Wt Readings from Last 3 Encounters:  02/26/19 179 lb 8 oz (81.4 kg)  03/06/18 185 lb 6.4 oz (84.1 kg)  03/22/17 191 lb (86.6 kg)   Wt noted. Lungs clear Cardiac RRR without m or g  Health Maintenance Due  Topic Date Due  . PAP SMEAR-Modifier  04/18/2015    There are no preventive care reminders to display for this patient.  Lab Results  Component Value Date   TSH 1.850  07/26/2018   Lab Results  Component Value Date   WBC 7.9 07/26/2018   HGB 14.0 07/26/2018   HCT 43.1 07/26/2018   MCV 93 07/26/2018   PLT 301 07/26/2018   Lab Results  Component Value Date   NA 143 07/26/2018   K 4.9   07/26/2018   CO2 22 07/26/2018   GLUCOSE 117 (H) 07/26/2018   BUN 14 07/26/2018   CREATININE 0.85 07/26/2018   BILITOT 0.3 07/26/2018   ALKPHOS 63 07/26/2018   AST 23 07/26/2018   ALT 22 07/26/2018   PROT 7.4 07/26/2018   ALBUMIN 4.7 07/26/2018   CALCIUM 10.4 (H) 07/26/2018   ANIONGAP 11 01/01/2015   Lab Results  Component Value Date   CHOL 159 03/08/2018   Lab Results  Component Value Date   HDL 36 (L) 03/08/2018   Lab Results  Component Value Date   LDLCALC 83 03/08/2018   Lab Results  Component Value Date   TRIG 199 (H) 03/08/2018   Lab Results  Component Value Date   CHOLHDL 4.4 03/08/2018   Lab Results  Component Value Date   HGBA1C 8.9 (A) 02/26/2019      Assessment & Plan:   Problem List Items Addressed This Visit    Diabetes mellitus type 2, controlled ([place]) - Primary   Relevant Medications   metFORMIN (GLUCOPHAGE-XR) 500 MG 24 hr tablet   Other Relevant Orders   HgB A1c (Completed)   Microalbumin / creatinine urine ratio   POCT UA - Microalbumin (Completed)      Meds ordered this encounter  Medications  . metFORMIN (GLUCOPHAGE-XR) 500 MG 24 hr tablet    Sig: One tab daily with breakfast for 2 weeks then two tabs daily with breakfast thereafter.    Dispense:  60 tablet    Refill:  2  . Zoster Vaccine Adjuvanted [facility]) injection    Sig: Inject 0.5 mLs into the muscle once for 1 dose.    Dispense:  0.5 mL    Refill:  1    Give 2nd dose 2-6 months after first dose.    Follow-up: No follow-ups on file.    Zenia Resides, MD

## 2019-03-02 ENCOUNTER — Encounter: Payer: Self-pay | Admitting: Family Medicine

## 2019-03-09 DIAGNOSIS — Z961 Presence of intraocular lens: Principal | ICD-10-CM

## 2019-03-09 DIAGNOSIS — H3321 Serous retinal detachment, right eye: Principal | ICD-10-CM

## 2019-03-09 DIAGNOSIS — Z7984 Long term (current) use of oral hypoglycemic drugs: Principal | ICD-10-CM

## 2019-03-09 DIAGNOSIS — E785 Hyperlipidemia, unspecified: Principal | ICD-10-CM

## 2019-03-09 DIAGNOSIS — H33021 Retinal detachment with multiple breaks, right eye: Principal | ICD-10-CM

## 2019-03-09 DIAGNOSIS — K219 Gastro-esophageal reflux disease without esophagitis: Principal | ICD-10-CM

## 2019-03-09 DIAGNOSIS — H4421 Degenerative myopia, right eye: Principal | ICD-10-CM

## 2019-03-09 DIAGNOSIS — E119 Type 2 diabetes mellitus without complications: Principal | ICD-10-CM

## 2019-03-09 DIAGNOSIS — Z9841 Cataract extraction status, right eye: Principal | ICD-10-CM

## 2019-03-09 DIAGNOSIS — K449 Diaphragmatic hernia without obstruction or gangrene: Principal | ICD-10-CM

## 2019-03-09 DIAGNOSIS — Z7982 Long term (current) use of aspirin: Principal | ICD-10-CM

## 2019-03-09 DIAGNOSIS — Z9842 Cataract extraction status, left eye: Principal | ICD-10-CM

## 2019-03-10 ENCOUNTER — Encounter: Admit: 2019-03-10 | Discharge: 2019-03-10 | Payer: PRIVATE HEALTH INSURANCE

## 2019-03-10 ENCOUNTER — Encounter
Admit: 2019-03-10 | Discharge: 2019-03-10 | Payer: PRIVATE HEALTH INSURANCE | Attending: Certified Registered" | Primary: Certified Registered"

## 2019-03-10 DIAGNOSIS — Z9842 Cataract extraction status, left eye: Principal | ICD-10-CM

## 2019-03-10 DIAGNOSIS — M1990 Unspecified osteoarthritis, unspecified site: Principal | ICD-10-CM

## 2019-03-10 DIAGNOSIS — Z7982 Long term (current) use of aspirin: Principal | ICD-10-CM

## 2019-03-10 DIAGNOSIS — K219 Gastro-esophageal reflux disease without esophagitis: Principal | ICD-10-CM

## 2019-03-10 DIAGNOSIS — H4421 Degenerative myopia, right eye: Principal | ICD-10-CM

## 2019-03-10 DIAGNOSIS — E119 Type 2 diabetes mellitus without complications: Principal | ICD-10-CM

## 2019-03-10 DIAGNOSIS — K589 Irritable bowel syndrome without diarrhea: Principal | ICD-10-CM

## 2019-03-10 DIAGNOSIS — H33021 Retinal detachment with multiple breaks, right eye: Principal | ICD-10-CM

## 2019-03-10 DIAGNOSIS — K449 Diaphragmatic hernia without obstruction or gangrene: Principal | ICD-10-CM

## 2019-03-10 DIAGNOSIS — E785 Hyperlipidemia, unspecified: Principal | ICD-10-CM

## 2019-03-10 DIAGNOSIS — Z961 Presence of intraocular lens: Principal | ICD-10-CM

## 2019-03-10 DIAGNOSIS — Z9841 Cataract extraction status, right eye: Principal | ICD-10-CM

## 2019-03-10 DIAGNOSIS — Z7984 Long term (current) use of oral hypoglycemic drugs: Principal | ICD-10-CM

## 2019-03-11 ENCOUNTER — Encounter: Admit: 2019-03-11 | Discharge: 2019-03-12 | Payer: PRIVATE HEALTH INSURANCE

## 2019-03-11 DIAGNOSIS — H3321 Serous retinal detachment, right eye: Principal | ICD-10-CM

## 2019-03-11 DIAGNOSIS — M1990 Unspecified osteoarthritis, unspecified site: Principal | ICD-10-CM

## 2019-03-11 DIAGNOSIS — E119 Type 2 diabetes mellitus without complications: Principal | ICD-10-CM

## 2019-03-11 DIAGNOSIS — E785 Hyperlipidemia, unspecified: Principal | ICD-10-CM

## 2019-03-11 DIAGNOSIS — K589 Irritable bowel syndrome without diarrhea: Principal | ICD-10-CM

## 2019-03-16 ENCOUNTER — Encounter: Payer: Self-pay | Admitting: Family Medicine

## 2019-03-16 DIAGNOSIS — E119 Type 2 diabetes mellitus without complications: Secondary | ICD-10-CM

## 2019-03-21 MED ORDER — METFORMIN HCL ER 500 MG PO TB24: 2000.0000 mg | ORAL_TABLET | Freq: Every day | ORAL | 3 refills | Status: AC

## 2019-04-01 ENCOUNTER — Other Ambulatory Visit: Payer: Self-pay | Admitting: Family Medicine

## 2019-04-01 DIAGNOSIS — E785 Hyperlipidemia, unspecified: Secondary | ICD-10-CM

## 2019-05-07 ENCOUNTER — Other Ambulatory Visit: Payer: Self-pay | Admitting: Family Medicine

## 2019-05-07 DIAGNOSIS — K2100 Gastro-esophageal reflux disease with esophagitis, without bleeding: Secondary | ICD-10-CM

## 2019-05-22 ENCOUNTER — Other Ambulatory Visit: Payer: Self-pay

## 2019-05-22 ENCOUNTER — Ambulatory Visit
Admission: RE | Admit: 2019-05-22 | Discharge: 2019-05-22 | Disposition: A | Discharge status: Home / Self Care | Payer: BLUE CROSS/BLUE SHIELD | Source: Ambulatory Visit | Attending: Obstetrics and Gynecology | Admitting: Obstetrics and Gynecology

## 2019-05-22 DIAGNOSIS — Z1231 Encounter for screening mammogram for malignant neoplasm of breast: Secondary | ICD-10-CM

## 2019-06-24 ENCOUNTER — Encounter: Payer: Self-pay | Admitting: Family Medicine

## 2019-07-15 ENCOUNTER — Telehealth: Payer: Self-pay | Admitting: Gastroenterology

## 2019-07-15 NOTE — Telephone Encounter (Signed)
Since she is changing gastroenterologists her PCP should make the referral or many GI offices allow patient to schedule directly without a referral. With a signed release we can send her GI records to her new GI physician.

## 2019-07-15 NOTE — Telephone Encounter (Signed)
Dr Fuller Plan Please advise on this.... Will you allow this referral?

## 2019-07-15 NOTE — Telephone Encounter (Signed)
Patient insurance has changed and will need to transfer GI doctor. Patient would like to know if we can send a referral to: Jacquiline Doe, MD [place], [place]

## 2019-07-16 NOTE — Telephone Encounter (Signed)
Called patient to inform her to contact her PCP to get the referral sent to new GI provider.... She said she would and will call back for records

## 2019-07-17 ENCOUNTER — Encounter: Payer: Self-pay | Admitting: Family Medicine

## 2019-07-24 ENCOUNTER — Encounter: Payer: Self-pay | Admitting: Family Medicine

## 2019-08-05 ENCOUNTER — Encounter: Payer: Self-pay | Admitting: Family Medicine

## 2019-08-06 ENCOUNTER — Encounter
Admit: 2019-08-06 | Discharge: 2019-08-07 | Payer: PRIVATE HEALTH INSURANCE | Attending: Gastroenterology | Primary: Gastroenterology

## 2019-08-06 DIAGNOSIS — K921 Melena: Secondary | ICD-10-CM

## 2019-08-06 DIAGNOSIS — K625 Hemorrhage of anus and rectum: Principal | ICD-10-CM

## 2019-08-06 DIAGNOSIS — K649 Unspecified hemorrhoids: Secondary | ICD-10-CM

## 2019-08-11 ENCOUNTER — Other Ambulatory Visit: Payer: Self-pay | Admitting: *Deleted

## 2019-08-11 ENCOUNTER — Other Ambulatory Visit: Payer: Self-pay | Admitting: Family Medicine

## 2019-08-12 ENCOUNTER — Telehealth: Payer: Self-pay | Admitting: Family Medicine

## 2019-08-12 LAB — PTH, INTACT AND CALCIUM
Calcium: 11 mg/dL — ABNORMAL HIGH (ref 8.7–10.3)
PTH: 19 pg/mL (ref 15–65)

## 2019-08-12 MED ORDER — PEG-ELECTROLYTE SOLUTION 420 GRAM ORAL SOLUTION
Freq: Once | ORAL | 0 refills | 1 days | Status: CP
Start: 2019-08-12 — End: 2019-08-12

## 2019-08-12 NOTE — Telephone Encounter (Signed)
Called patient.  Stopped calcium and vit D.  No other causes.  Intrigingly, Patient had a bone density of +3.6 on AP spine in 2018.  I feel she needs plain films to rule out sclerotic bone.  Likely refer to endo.  She will get me a name on her insurance.

## 2019-08-12 NOTE — Progress Notes (Signed)
Lab tests show high calcium and low-normal PTH.     Non-parathyroid mediated Hypercalcemia of malignancy PTHrP Increased calcitriol (activation of extrarenal 1-alpha-hydroxylase) Osteolytic bone metastases and local cytokines Vitamin D intoxication Chronic granulomatous disorders Increased calcitriol (activation of extrarenal 1-alpha-hydroxylase) Medications Thiazide diuretics Lithium Teriparatide Abaloparatide Excessive vitamin A Theophylline toxicity Miscellaneous Hyperthyroidism Acromegaly Pheochromocytoma Adrenal insufficiency Immobilization Parenteral nutrition Milk-alkali syndrome    Up to date on cancer screening.  This has been a longstanding problem.  Doubt serious issue and likely just follow.  Of note, recent outside labs showed normal Alk phos and TSH.  Also bone density normal in 08/15/2017.  (unusual that AP spine had T score of +3.6)             From Up to Date

## 2019-08-18 ENCOUNTER — Telehealth: Payer: Self-pay | Admitting: Family Medicine

## 2019-08-18 NOTE — Telephone Encounter (Signed)
Orders placed as requested  [facility] on [address] with Dr. Bettina Gavia.

## 2019-08-18 NOTE — Telephone Encounter (Signed)
Pt is calling because she needs 2 referrals placed. Diagonostic imaging for lower back and this needs to go to [facility]. She also needs a referral to [facility] on [facility] with Dr. Bettina Gavia. Her insurance requires her to go to [facility].

## 2019-08-20 ENCOUNTER — Telehealth: Payer: Self-pay | Admitting: Family Medicine

## 2019-08-29 ENCOUNTER — Encounter: Payer: Self-pay | Admitting: Family Medicine

## 2019-08-30 ENCOUNTER — Encounter: Admit: 2019-08-30 | Discharge: 2019-08-31 | Payer: PRIVATE HEALTH INSURANCE

## 2019-08-30 DIAGNOSIS — Z1159 Encounter for screening for other viral diseases: Principal | ICD-10-CM

## 2019-08-30 DIAGNOSIS — Z01818 Encounter for other preprocedural examination: Secondary | ICD-10-CM

## 2019-08-30 DIAGNOSIS — Z20828 Contact with and (suspected) exposure to other viral communicable diseases: Secondary | ICD-10-CM

## 2019-09-01 DIAGNOSIS — M47899 Other spondylosis, site unspecified: Secondary | ICD-10-CM

## 2019-09-01 NOTE — Telephone Encounter (Signed)
New Rx order written and will be faxed.

## 2019-09-02 ENCOUNTER — Ambulatory Visit: Admit: 2019-09-02 | Discharge: 2019-09-02 | Payer: PRIVATE HEALTH INSURANCE

## 2019-09-02 ENCOUNTER — Encounter: Admit: 2019-09-02 | Discharge: 2019-09-02 | Payer: PRIVATE HEALTH INSURANCE

## 2019-09-02 DIAGNOSIS — D127 Benign neoplasm of rectosigmoid junction: Secondary | ICD-10-CM

## 2019-09-02 DIAGNOSIS — K625 Hemorrhage of anus and rectum: Principal | ICD-10-CM

## 2019-09-02 DIAGNOSIS — Z7984 Long term (current) use of oral hypoglycemic drugs: Secondary | ICD-10-CM

## 2019-09-02 DIAGNOSIS — E785 Hyperlipidemia, unspecified: Secondary | ICD-10-CM

## 2019-09-02 DIAGNOSIS — Z79899 Other long term (current) drug therapy: Secondary | ICD-10-CM

## 2019-09-02 DIAGNOSIS — E119 Type 2 diabetes mellitus without complications: Secondary | ICD-10-CM

## 2019-09-03 ENCOUNTER — Ambulatory Visit: Admit: 2019-09-03 | Discharge: 2019-09-04 | Payer: PRIVATE HEALTH INSURANCE

## 2019-09-03 DIAGNOSIS — M47899 Other spondylosis, site unspecified: Principal | ICD-10-CM

## 2019-09-03 DIAGNOSIS — M4326 Fusion of spine, lumbar region: Secondary | ICD-10-CM

## 2019-09-03 DIAGNOSIS — M5136 Other intervertebral disc degeneration, lumbar region: Secondary | ICD-10-CM

## 2019-10-06 ENCOUNTER — Encounter: Payer: Self-pay | Admitting: Family Medicine

## 2019-10-06 DIAGNOSIS — D126 Benign neoplasm of colon, unspecified: Secondary | ICD-10-CM | POA: Insufficient documentation

## 2019-10-14 ENCOUNTER — Other Ambulatory Visit: Payer: Self-pay | Admitting: Family Medicine

## 2019-10-15 ENCOUNTER — Encounter: Payer: Self-pay | Admitting: Family Medicine

## 2019-10-29 ENCOUNTER — Encounter
Admit: 2019-10-29 | Discharge: 2019-10-30 | Payer: PRIVATE HEALTH INSURANCE | Attending: Internal Medicine | Primary: Internal Medicine

## 2019-11-05 LAB — HM DIABETES EYE EXAM

## 2019-11-10 ENCOUNTER — Encounter: Payer: Self-pay | Admitting: Family Medicine

## 2019-11-10 ENCOUNTER — Encounter: Admit: 2019-11-10 | Discharge: 2019-12-09 | Payer: PRIVATE HEALTH INSURANCE

## 2019-11-10 ENCOUNTER — Encounter: Admit: 2019-11-10 | Discharge: 2019-11-23 | Payer: PRIVATE HEALTH INSURANCE

## 2019-11-26 ENCOUNTER — Encounter: Admit: 2019-11-26 | Discharge: 2019-11-27 | Payer: PRIVATE HEALTH INSURANCE

## 2020-01-28 ENCOUNTER — Encounter
Admit: 2020-01-28 | Discharge: 2020-01-29 | Payer: PRIVATE HEALTH INSURANCE | Attending: Internal Medicine | Primary: Internal Medicine

## 2020-02-07 ENCOUNTER — Other Ambulatory Visit: Payer: Self-pay | Admitting: Family Medicine

## 2020-02-07 DIAGNOSIS — E785 Hyperlipidemia, unspecified: Secondary | ICD-10-CM

## 2020-03-15 ENCOUNTER — Ambulatory Visit: Admit: 2020-03-15 | Discharge: 2020-03-16 | Payer: PRIVATE HEALTH INSURANCE

## 2020-03-15 DIAGNOSIS — E785 Hyperlipidemia, unspecified: Principal | ICD-10-CM

## 2020-03-15 DIAGNOSIS — L4050 Arthropathic psoriasis, unspecified: Principal | ICD-10-CM

## 2020-03-15 DIAGNOSIS — K219 Gastro-esophageal reflux disease without esophagitis: Principal | ICD-10-CM

## 2020-03-15 DIAGNOSIS — E118 Type 2 diabetes mellitus with unspecified complications: Principal | ICD-10-CM

## 2020-03-15 DIAGNOSIS — D351 Benign neoplasm of parathyroid gland: Principal | ICD-10-CM

## 2020-03-15 DIAGNOSIS — E213 Hyperparathyroidism, unspecified: Principal | ICD-10-CM

## 2020-03-18 MED ORDER — METFORMIN ER 500 MG TABLET,EXTENDED RELEASE 24 HR
ORAL_TABLET | Freq: Every day | ORAL | 3 refills | 22 days | Status: CP
Start: 2020-03-18 — End: ?

## 2020-03-22 MED ORDER — FENOFIBRATE 160 MG TABLET
ORAL_TABLET | Freq: Every day | ORAL | 1 refills | 90.00000 days | Status: CP
Start: 2020-03-22 — End: 2021-03-22

## 2020-03-22 MED ORDER — OMEPRAZOLE 40 MG CAPSULE,DELAYED RELEASE
ORAL_CAPSULE | Freq: Two times a day (BID) | ORAL | 1 refills | 90 days | Status: CP
Start: 2020-03-22 — End: 2021-03-22

## 2020-03-29 ENCOUNTER — Encounter: Payer: Self-pay | Admitting: Family Medicine

## 2020-04-03 ENCOUNTER — Ambulatory Visit: Payer: BLUE CROSS/BLUE SHIELD

## 2020-04-06 ENCOUNTER — Ambulatory Visit: Payer: BLUE CROSS/BLUE SHIELD | Attending: Internal Medicine

## 2020-04-06 DIAGNOSIS — Z23 Encounter for immunization: Secondary | ICD-10-CM

## 2020-04-06 NOTE — Progress Notes (Signed)
   Covid-19 Vaccination Clinic  Name:  Michele Carter    MRN: [id number] DOB: 28-Sep-1961  04/06/2020  Ms. Bartolucci was observed post Covid-19 immunization for 15 minutes without incident. She was provided with Vaccine Information Sheet and instruction to access the V-Safe system.   Ms. Helming was instructed to call 911 with any severe reactions post vaccine: Marland Kitchen Difficulty breathing  . Swelling of face and throat  . A fast heartbeat  . A bad rash all over body  . Dizziness and weakness   Immunizations Administered    Name Date Dose VIS Date Route   Pfizer COVID-19 Vaccine 04/06/2020  8:38 AM 0.3 mL 02/11/2019 Intramuscular   Manufacturer: [employer], [employer]   Lot: [id number]   [place]: [id number]

## 2020-04-07 ENCOUNTER — Encounter: Admit: 2020-04-07 | Discharge: 2020-04-08 | Payer: PRIVATE HEALTH INSURANCE

## 2020-04-07 DIAGNOSIS — N3090 Cystitis, unspecified without hematuria: Principal | ICD-10-CM

## 2020-04-07 DIAGNOSIS — R350 Frequency of micturition: Principal | ICD-10-CM

## 2020-04-07 MED ORDER — METFORMIN ER 500 MG TABLET,EXTENDED RELEASE 24 HR
ORAL_TABLET | Freq: Every day | ORAL | 3 refills | 90.00000 days | Status: CP
Start: 2020-04-07 — End: 2020-04-07

## 2020-04-07 MED ORDER — METFORMIN ER 500 MG TABLET,EXTENDED RELEASE 24 HR: 2000 mg | tablet | Freq: Every day | 3 refills | 90 days | Status: AC

## 2020-04-07 MED ORDER — NITROFURANTOIN MONOHYDRATE/MACROCRYSTALS 100 MG CAPSULE
ORAL_CAPSULE | Freq: Two times a day (BID) | ORAL | 0 refills | 7 days | Status: CP
Start: 2020-04-07 — End: 2020-04-14

## 2020-04-16 MED ORDER — CITALOPRAM 10 MG TABLET
ORAL_TABLET | Freq: Every day | ORAL | 1 refills | 90 days | Status: CP
Start: 2020-04-16 — End: ?

## 2020-04-28 ENCOUNTER — Ambulatory Visit: Payer: BLUE CROSS/BLUE SHIELD | Attending: Internal Medicine

## 2020-04-28 ENCOUNTER — Ambulatory Visit: Payer: BLUE CROSS/BLUE SHIELD

## 2020-04-28 ENCOUNTER — Other Ambulatory Visit: Payer: Self-pay

## 2020-04-28 DIAGNOSIS — Z23 Encounter for immunization: Secondary | ICD-10-CM

## 2020-04-28 NOTE — Progress Notes (Signed)
   Covid-19 Vaccination Clinic  Name:  Leveta Deangelo    MRN: [id number] DOB: 06-26-1961  04/28/2020  Ms. Mcconaghy was observed post Covid-19 immunization for 15 minutes without incident. She was provided with Vaccine Information Sheet and instruction to access the V-Safe system.   Ms. Siegel was instructed to call 911 with any severe reactions post vaccine: Marland Kitchen Difficulty breathing  . Swelling of face and throat  . A fast heartbeat  . A bad rash all over body  . Dizziness and weakness   Immunizations Administered    Name Date Dose VIS Date Route   Pfizer COVID-19 Vaccine 04/28/2020  8:35 AM 0.3 mL 02/11/2019 Intramuscular   Manufacturer: [place]   Lot: [id number]   [place]: [id number]

## 2020-05-25 DIAGNOSIS — Z1239 Encounter for other screening for malignant neoplasm of breast: Principal | ICD-10-CM

## 2020-05-26 DIAGNOSIS — Z1239 Encounter for other screening for malignant neoplasm of breast: Principal | ICD-10-CM

## 2020-05-31 MED ORDER — OMEPRAZOLE 40 MG CAPSULE,DELAYED RELEASE
ORAL_CAPSULE | Freq: Two times a day (BID) | ORAL | 1 refills | 90 days | Status: CP
Start: 2020-05-31 — End: 2021-05-31

## 2020-07-12 ENCOUNTER — Ambulatory Visit: Admit: 2020-07-12 | Discharge: 2020-07-13 | Payer: PRIVATE HEALTH INSURANCE

## 2020-07-12 ENCOUNTER — Encounter: Admit: 2020-07-12 | Discharge: 2020-07-13 | Payer: PRIVATE HEALTH INSURANCE

## 2020-07-13 ENCOUNTER — Ambulatory Visit: Admit: 2020-07-13 | Discharge: 2020-07-14 | Payer: PRIVATE HEALTH INSURANCE

## 2020-08-10 ENCOUNTER — Ambulatory Visit: Admit: 2020-08-10 | Discharge: 2020-08-11 | Payer: PRIVATE HEALTH INSURANCE

## 2020-08-10 DIAGNOSIS — E118 Type 2 diabetes mellitus with unspecified complications: Principal | ICD-10-CM

## 2020-08-10 DIAGNOSIS — Z0000 Encounter for general adult medical examination without abnormal findings: Principal | ICD-10-CM

## 2020-08-17 MED ORDER — OMEPRAZOLE 40 MG CAPSULE,DELAYED RELEASE
ORAL_CAPSULE | Freq: Two times a day (BID) | ORAL | 1 refills | 90 days | Status: CP
Start: 2020-08-17 — End: 2021-08-17

## 2020-08-17 MED ORDER — PANTOPRAZOLE 40 MG TABLET,DELAYED RELEASE
ORAL_TABLET | Freq: Every day | ORAL | 1 refills | 30 days | Status: CP
Start: 2020-08-17 — End: 2020-08-17

## 2020-09-14 MED ORDER — FENOFIBRATE 160 MG TABLET
ORAL_TABLET | Freq: Every day | ORAL | 1 refills | 90 days | Status: CP
Start: 2020-09-14 — End: 2021-09-14

## 2020-09-30 MED ORDER — METFORMIN ER 500 MG TABLET,EXTENDED RELEASE 24 HR
ORAL_TABLET | 0 refills | 0 days | Status: CP
Start: 2020-09-30 — End: ?

## 2020-10-07 MED ORDER — CITALOPRAM 10 MG TABLET
ORAL_TABLET | 1 refills | 0 days | Status: CP
Start: 2020-10-07 — End: ?

## 2020-11-18 ENCOUNTER — Ambulatory Visit: Admit: 2020-11-18 | Discharge: 2020-11-19 | Payer: PRIVATE HEALTH INSURANCE

## 2020-11-18 ENCOUNTER — Encounter: Admit: 2020-11-18 | Discharge: 2020-11-19 | Payer: PRIVATE HEALTH INSURANCE

## 2020-11-18 DIAGNOSIS — G459 Transient cerebral ischemic attack, unspecified: Principal | ICD-10-CM

## 2020-11-18 DIAGNOSIS — R200 Anesthesia of skin: Principal | ICD-10-CM

## 2021-01-05 ENCOUNTER — Encounter
Admit: 2021-01-05 | Discharge: 2021-01-06 | Payer: PRIVATE HEALTH INSURANCE | Attending: Student in an Organized Health Care Education/Training Program | Primary: Student in an Organized Health Care Education/Training Program

## 2021-01-05 DIAGNOSIS — R200 Anesthesia of skin: Principal | ICD-10-CM

## 2021-01-12 MED ORDER — METFORMIN ER 500 MG TABLET,EXTENDED RELEASE 24 HR
ORAL_TABLET | 0 refills | 0 days | Status: CP
Start: 2021-01-12 — End: ?

## 2021-01-26 ENCOUNTER — Encounter: Admit: 2021-01-26 | Discharge: 2021-01-27 | Payer: PRIVATE HEALTH INSURANCE

## 2021-01-26 DIAGNOSIS — R200 Anesthesia of skin: Principal | ICD-10-CM

## 2021-02-10 ENCOUNTER — Encounter: Admit: 2021-02-10 | Discharge: 2021-02-11 | Payer: PRIVATE HEALTH INSURANCE

## 2021-02-10 DIAGNOSIS — K219 Gastro-esophageal reflux disease without esophagitis: Principal | ICD-10-CM

## 2021-02-10 DIAGNOSIS — L4050 Arthropathic psoriasis, unspecified: Principal | ICD-10-CM

## 2021-02-10 DIAGNOSIS — E119 Type 2 diabetes mellitus without complications: Principal | ICD-10-CM

## 2021-02-10 DIAGNOSIS — E785 Hyperlipidemia, unspecified: Principal | ICD-10-CM

## 2021-02-10 DIAGNOSIS — L918 Other hypertrophic disorders of the skin: Principal | ICD-10-CM

## 2021-02-10 DIAGNOSIS — E118 Type 2 diabetes mellitus with unspecified complications: Principal | ICD-10-CM

## 2021-02-10 DIAGNOSIS — R200 Anesthesia of skin: Principal | ICD-10-CM

## 2021-02-10 DIAGNOSIS — E213 Hyperparathyroidism, unspecified: Principal | ICD-10-CM

## 2021-02-10 DIAGNOSIS — F418 Other specified anxiety disorders: Principal | ICD-10-CM

## 2021-02-21 ENCOUNTER — Encounter: Admit: 2021-02-21 | Discharge: 2021-02-22 | Payer: PRIVATE HEALTH INSURANCE

## 2021-03-08 ENCOUNTER — Other Ambulatory Visit: Payer: Self-pay | Admitting: Family Medicine

## 2021-03-08 DIAGNOSIS — E785 Hyperlipidemia, unspecified: Secondary | ICD-10-CM

## 2021-03-09 ENCOUNTER — Other Ambulatory Visit: Admit: 2021-03-09 | Discharge: 2021-03-10 | Payer: PRIVATE HEALTH INSURANCE

## 2021-03-09 DIAGNOSIS — E213 Hyperparathyroidism, unspecified: Principal | ICD-10-CM

## 2021-03-09 MED ORDER — FENOFIBRATE 160 MG TABLET
ORAL_TABLET | Freq: Every day | ORAL | 1 refills | 90 days | Status: CP
Start: 2021-03-09 — End: ?

## 2021-03-09 MED ORDER — ATORVASTATIN 10 MG TABLET
ORAL_TABLET | Freq: Every day | ORAL | 3 refills | 90 days | Status: CP
Start: 2021-03-09 — End: ?

## 2021-03-22 ENCOUNTER — Encounter
Admit: 2021-03-22 | Discharge: 2021-03-23 | Payer: PRIVATE HEALTH INSURANCE | Attending: Internal Medicine | Primary: Internal Medicine

## 2021-03-26 DIAGNOSIS — E213 Hyperparathyroidism, unspecified: Principal | ICD-10-CM

## 2021-04-21 MED ORDER — METFORMIN ER 500 MG TABLET,EXTENDED RELEASE 24 HR
ORAL_TABLET | Freq: Every day | ORAL | 0 refills | 90 days | Status: CP
Start: 2021-04-21 — End: 2022-04-21

## 2021-05-06 MED ORDER — METFORMIN ER 500 MG TABLET,EXTENDED RELEASE 24 HR
ORAL_TABLET | Freq: Every day | ORAL | 0 refills | 360 days | Status: CP
Start: 2021-05-06 — End: ?

## 2021-05-27 ENCOUNTER — Encounter: Admit: 2021-05-27 | Discharge: 2021-05-28 | Payer: PRIVATE HEALTH INSURANCE | Attending: Family | Primary: Family

## 2021-05-27 DIAGNOSIS — R350 Frequency of micturition: Principal | ICD-10-CM

## 2021-05-27 DIAGNOSIS — R300 Dysuria: Principal | ICD-10-CM

## 2021-05-27 MED ORDER — NITROFURANTOIN MONOHYDRATE/MACROCRYSTALS 100 MG CAPSULE
ORAL_CAPSULE | Freq: Two times a day (BID) | ORAL | 0 refills | 10 days | Status: CP
Start: 2021-05-27 — End: ?

## 2021-06-07 ENCOUNTER — Ambulatory Visit: Admit: 2021-06-07 | Discharge: 2021-06-08 | Payer: PRIVATE HEALTH INSURANCE

## 2021-06-07 DIAGNOSIS — M5432 Sciatica, left side: Principal | ICD-10-CM

## 2021-06-07 DIAGNOSIS — R252 Cramp and spasm: Principal | ICD-10-CM

## 2021-06-07 MED ORDER — METHYLPREDNISOLONE 4 MG TABLETS IN A DOSE PACK
Freq: Every day | ORAL | 0 refills | 1 days | Status: CP
Start: 2021-06-07 — End: 2022-06-07

## 2021-06-13 DIAGNOSIS — Z1231 Encounter for screening mammogram for malignant neoplasm of breast: Principal | ICD-10-CM

## 2021-06-13 DIAGNOSIS — L4050 Arthropathic psoriasis, unspecified: Principal | ICD-10-CM

## 2021-07-20 ENCOUNTER — Encounter
Admit: 2021-07-20 | Discharge: 2021-07-21 | Payer: PRIVATE HEALTH INSURANCE | Attending: Student in an Organized Health Care Education/Training Program | Primary: Student in an Organized Health Care Education/Training Program

## 2021-07-20 DIAGNOSIS — E213 Hyperparathyroidism, unspecified: Principal | ICD-10-CM

## 2021-07-20 DIAGNOSIS — Z8639 Personal history of other endocrine, nutritional and metabolic disease: Principal | ICD-10-CM

## 2021-07-23 MED ORDER — BLOOD SUGAR DIAGNOSTIC STRIPS
ORAL_STRIP | 2 refills | 0 days | Status: CP
Start: 2021-07-23 — End: ?

## 2021-08-01 ENCOUNTER — Encounter: Admit: 2021-08-01 | Discharge: 2021-08-02 | Payer: PRIVATE HEALTH INSURANCE

## 2021-08-23 MED ORDER — OMEPRAZOLE 40 MG CAPSULE,DELAYED RELEASE
ORAL_CAPSULE | Freq: Two times a day (BID) | ORAL | 1 refills | 90 days | Status: CP
Start: 2021-08-23 — End: 2022-08-23

## 2021-08-31 MED ORDER — FENOFIBRATE 160 MG TABLET
ORAL_TABLET | Freq: Every day | ORAL | 1 refills | 90 days | Status: CP
Start: 2021-08-31 — End: 2022-08-31

## 2021-09-05 ENCOUNTER — Ambulatory Visit: Admit: 2021-09-05 | Discharge: 2021-09-05 | Payer: PRIVATE HEALTH INSURANCE

## 2021-09-05 DIAGNOSIS — L4050 Arthropathic psoriasis, unspecified: Principal | ICD-10-CM

## 2021-09-05 MED ORDER — ENBREL SURECLICK 50 MG/ML (1 ML) SUBCUTANEOUS PEN INJECTOR
SUBCUTANEOUS | 2 refills | 28 days | Status: CP
Start: 2021-09-05 — End: 2021-10-05

## 2021-09-09 NOTE — Unmapped (Signed)
[facility] Specialty Medication Onboarding    Specialty Medication: [name] Sureclick 50mg /ml  Prior Authorization: Not Required   Financial Assistance: No - copay  <$25  Final Copay/Day Supply: $0 / 28 days    Insurance Restrictions: None     Notes to Pharmacist: was refill too soon until today- received paid claim $0    The triage team has completed the benefits investigation and has determined that the patient is able to fill this medication at [facility]. Please contact the patient to complete the onboarding or follow up with the prescribing physician as needed.

## 2021-09-09 NOTE — Unmapped (Signed)
Specialty Medication(s): [name]    Amy Blanchard has been dis-enrolled from the [name] specialty pharmacy services due to Patient declined filling at [name] at this time.  She receives medication from [name] and does not want to file through her insurance.  Informed patient that copay is currently $0 through insurance but she states she has been with [name] for the last 10 years without issues.  Patient has our phone number to call if she would like to fill with [place] in the future. .    Additional information provided to the patient: n/a    Laurene Footman Zaydee Aina  [name] Specialty Pharmacist

## 2021-09-10 ENCOUNTER — Ambulatory Visit: Admit: 2021-09-10 | Discharge: 2021-09-10 | Payer: PRIVATE HEALTH INSURANCE

## 2021-09-20 DIAGNOSIS — Z1231 Encounter for screening mammogram for malignant neoplasm of breast: Principal | ICD-10-CM

## 2021-10-30 MED ORDER — CITALOPRAM 10 MG TABLET
ORAL_TABLET | 1 refills | 0 days
Start: 2021-10-30 — End: 2022-10-30

## 2021-10-31 MED ORDER — CITALOPRAM 10 MG TABLET
ORAL_TABLET | 1 refills | 0 days | Status: CP
Start: 2021-10-31 — End: 2022-10-30

## 2022-01-09 DIAGNOSIS — R252 Cramp and spasm: Principal | ICD-10-CM

## 2022-01-09 MED ORDER — GABAPENTIN 100 MG CAPSULE
ORAL_CAPSULE | 3 refills | 0 days | Status: CP
Start: 2022-01-09 — End: ?

## 2022-02-07 ENCOUNTER — Ambulatory Visit: Admit: 2022-02-07 | Discharge: 2022-02-08 | Payer: PRIVATE HEALTH INSURANCE

## 2022-02-07 DIAGNOSIS — E119 Type 2 diabetes mellitus without complications: Principal | ICD-10-CM

## 2022-02-07 DIAGNOSIS — K5900 Constipation, unspecified: Principal | ICD-10-CM

## 2022-02-07 DIAGNOSIS — R634 Abnormal weight loss: Principal | ICD-10-CM

## 2022-02-07 DIAGNOSIS — E785 Hyperlipidemia, unspecified: Principal | ICD-10-CM

## 2022-02-07 DIAGNOSIS — L4050 Arthropathic psoriasis, unspecified: Principal | ICD-10-CM

## 2022-02-07 DIAGNOSIS — R252 Cramp and spasm: Principal | ICD-10-CM

## 2022-02-07 DIAGNOSIS — E118 Type 2 diabetes mellitus with unspecified complications: Principal | ICD-10-CM

## 2022-02-07 DIAGNOSIS — Z0000 Encounter for general adult medical examination without abnormal findings: Principal | ICD-10-CM

## 2022-02-07 DIAGNOSIS — K921 Melena: Principal | ICD-10-CM

## 2022-02-07 DIAGNOSIS — F418 Other specified anxiety disorders: Principal | ICD-10-CM

## 2022-02-07 DIAGNOSIS — G4762 Sleep related leg cramps: Principal | ICD-10-CM

## 2022-02-07 DIAGNOSIS — E213 Hyperparathyroidism, unspecified: Principal | ICD-10-CM

## 2022-02-07 DIAGNOSIS — K648 Other hemorrhoids: Principal | ICD-10-CM

## 2022-02-07 MED ORDER — METFORMIN ER 500 MG TABLET,EXTENDED RELEASE 24 HR
ORAL_TABLET | 3 refills | 0 days | Status: CP
Start: 2022-02-07 — End: ?

## 2022-02-07 MED ORDER — HYDROCORTISONE ACETATE 25 MG RECTAL SUPPOSITORY
Freq: Two times a day (BID) | RECTAL | 2 refills | 12 days | Status: CP
Start: 2022-02-07 — End: 2023-02-07

## 2022-02-07 MED ORDER — GABAPENTIN 100 MG CAPSULE
ORAL_CAPSULE | 11 refills | 0 days | Status: CP
Start: 2022-02-07 — End: ?

## 2022-02-16 MED ORDER — OMEPRAZOLE 40 MG CAPSULE,DELAYED RELEASE
ORAL_CAPSULE | Freq: Two times a day (BID) | ORAL | 1 refills | 90 days | Status: CP
Start: 2022-02-16 — End: 2023-02-17

## 2022-02-16 MED ORDER — HYDROCORTISONE 2.5 % TOPICAL CREAM
Freq: Two times a day (BID) | TOPICAL | 5 refills | 0 days | Status: CP
Start: 2022-02-16 — End: 2023-02-16

## 2022-02-17 MED ORDER — HYDROCORTISONE ACETATE 25 MG RECTAL SUPPOSITORY
Freq: Two times a day (BID) | RECTAL | 2 refills | 12 days | Status: CP
Start: 2022-02-17 — End: 2023-02-17

## 2022-02-27 ENCOUNTER — Ambulatory Visit: Admit: 2022-02-27 | Discharge: 2022-02-28

## 2022-02-27 DIAGNOSIS — E119 Type 2 diabetes mellitus without complications: Principal | ICD-10-CM

## 2022-02-27 MED ORDER — ATORVASTATIN 10 MG TABLET
ORAL_TABLET | Freq: Every evening | ORAL | 1 refills | 90 days | Status: CP
Start: 2022-02-27 — End: 2023-02-28

## 2022-02-27 MED ORDER — FENOFIBRATE 160 MG TABLET
ORAL_TABLET | Freq: Every day | ORAL | 1 refills | 90 days | Status: CP
Start: 2022-02-27 — End: 2023-02-28

## 2022-02-28 MED ORDER — TRULICITY 0.75 MG/0.5 ML SUBCUTANEOUS PEN INJECTOR
SUBCUTANEOUS | 2 refills | 28 days | Status: CP
Start: 2022-02-28 — End: ?

## 2022-03-02 DIAGNOSIS — K648 Other hemorrhoids: Principal | ICD-10-CM

## 2022-04-04 ENCOUNTER — Ambulatory Visit: Admit: 2022-04-04 | Discharge: 2022-04-05

## 2022-04-04 DIAGNOSIS — E119 Type 2 diabetes mellitus without complications: Principal | ICD-10-CM

## 2022-04-10 ENCOUNTER — Ambulatory Visit: Admit: 2022-04-10 | Discharge: 2022-04-11 | Payer: PRIVATE HEALTH INSURANCE

## 2022-04-10 DIAGNOSIS — Z79899 Other long term (current) drug therapy: Principal | ICD-10-CM

## 2022-04-10 DIAGNOSIS — L4050 Arthropathic psoriasis, unspecified: Principal | ICD-10-CM

## 2022-04-20 MED ORDER — CITALOPRAM 10 MG TABLET
ORAL_TABLET | 1 refills | 0 days
Start: 2022-04-20 — End: ?

## 2022-04-21 DIAGNOSIS — N183 Type 1 diabetes mellitus with stage 3 chronic kidney disease, unspecified whether stage 3a or 3b CKD (CMS-HCC): Principal | ICD-10-CM

## 2022-04-21 DIAGNOSIS — E1022 Type 1 diabetes mellitus with diabetic chronic kidney disease: Principal | ICD-10-CM

## 2022-04-21 DIAGNOSIS — E119 Type 2 diabetes mellitus without complications: Principal | ICD-10-CM

## 2022-04-21 MED ORDER — CITALOPRAM 10 MG TABLET
ORAL_TABLET | 1 refills | 0 days | Status: CP
Start: 2022-04-21 — End: 2023-04-21

## 2022-04-21 MED ORDER — LANCETS 28 GAUGE
0 refills | 0 days | Status: CP
Start: 2022-04-21 — End: ?

## 2022-05-10 MED ORDER — TRULICITY 0.75 MG/0.5 ML SUBCUTANEOUS PEN INJECTOR
SUBCUTANEOUS | 2 refills | 28.00000 days
Start: 2022-05-10 — End: ?

## 2022-05-10 MED ORDER — ATORVASTATIN 10 MG TABLET
ORAL_TABLET | Freq: Every evening | ORAL | 1 refills | 90 days
Start: 2022-05-10 — End: 2023-05-11

## 2022-05-10 MED ORDER — FENOFIBRATE 160 MG TABLET
ORAL_TABLET | Freq: Every day | ORAL | 1 refills | 90 days
Start: 2022-05-10 — End: 2023-05-11

## 2022-05-10 MED ORDER — CITALOPRAM 10 MG TABLET
ORAL_TABLET | Freq: Every day | ORAL | 1 refills | 90 days
Start: 2022-05-10 — End: 2023-05-10

## 2022-05-11 ENCOUNTER — Ambulatory Visit: Admit: 2022-05-11 | Discharge: 2022-05-12 | Payer: PRIVATE HEALTH INSURANCE

## 2022-05-11 DIAGNOSIS — D351 Benign neoplasm of parathyroid gland: Principal | ICD-10-CM

## 2022-05-11 DIAGNOSIS — F418 Other specified anxiety disorders: Principal | ICD-10-CM

## 2022-05-11 DIAGNOSIS — L4050 Arthropathic psoriasis, unspecified: Principal | ICD-10-CM

## 2022-05-11 DIAGNOSIS — R3915 Urgency of urination: Principal | ICD-10-CM

## 2022-05-11 DIAGNOSIS — E213 Hyperparathyroidism, unspecified: Principal | ICD-10-CM

## 2022-05-11 DIAGNOSIS — K648 Other hemorrhoids: Principal | ICD-10-CM

## 2022-05-11 DIAGNOSIS — R350 Frequency of micturition: Principal | ICD-10-CM

## 2022-05-11 DIAGNOSIS — E118 Type 2 diabetes mellitus with unspecified complications: Principal | ICD-10-CM

## 2022-05-11 DIAGNOSIS — K449 Diaphragmatic hernia without obstruction or gangrene: Principal | ICD-10-CM

## 2022-05-11 DIAGNOSIS — E119 Type 2 diabetes mellitus without complications: Principal | ICD-10-CM

## 2022-05-11 DIAGNOSIS — R300 Dysuria: Principal | ICD-10-CM

## 2022-05-11 DIAGNOSIS — K219 Gastro-esophageal reflux disease without esophagitis: Principal | ICD-10-CM

## 2022-05-11 DIAGNOSIS — K589 Irritable bowel syndrome without diarrhea: Principal | ICD-10-CM

## 2022-05-11 DIAGNOSIS — E785 Hyperlipidemia, unspecified: Principal | ICD-10-CM

## 2022-05-11 MED ORDER — CITALOPRAM 10 MG TABLET
ORAL_TABLET | Freq: Every day | ORAL | 1 refills | 90 days | Status: CN
Start: 2022-05-11 — End: 2023-05-11

## 2022-05-11 MED ORDER — NITROFURANTOIN MONOHYDRATE/MACROCRYSTALS 100 MG CAPSULE
ORAL_CAPSULE | Freq: Two times a day (BID) | ORAL | 0 refills | 7 days | Status: CP
Start: 2022-05-11 — End: 2022-05-18

## 2022-05-11 MED ORDER — ATORVASTATIN 10 MG TABLET
ORAL_TABLET | Freq: Every evening | ORAL | 1 refills | 90 days | Status: CN
Start: 2022-05-11 — End: 2023-05-12

## 2022-05-11 MED ORDER — TRULICITY 0.75 MG/0.5 ML SUBCUTANEOUS PEN INJECTOR
SUBCUTANEOUS | 2 refills | 28 days | Status: CN
Start: 2022-05-11 — End: ?

## 2022-05-11 MED ORDER — FENOFIBRATE 160 MG TABLET
ORAL_TABLET | Freq: Every day | ORAL | 1 refills | 90.00000 days | Status: CN
Start: 2022-05-11 — End: 2023-05-12

## 2022-05-23 ENCOUNTER — Encounter: Payer: Self-pay | Admitting: *Deleted

## 2022-05-26 DIAGNOSIS — E119 Type 2 diabetes mellitus without complications: Principal | ICD-10-CM

## 2022-05-26 MED ORDER — TRULICITY 0.75 MG/0.5 ML SUBCUTANEOUS PEN INJECTOR
2 refills | 0 days
Start: 2022-05-26 — End: ?

## 2022-05-28 MED ORDER — TRULICITY 0.75 MG/0.5 ML SUBCUTANEOUS PEN INJECTOR
2 refills | 0 days | Status: CP
Start: 2022-05-28 — End: ?

## 2022-06-16 ENCOUNTER — Other Ambulatory Visit: Admit: 2022-06-16 | Discharge: 2022-06-17 | Payer: PRIVATE HEALTH INSURANCE

## 2022-06-16 DIAGNOSIS — R350 Frequency of micturition: Principal | ICD-10-CM

## 2022-06-16 DIAGNOSIS — R8290 Unspecified abnormal findings in urine: Principal | ICD-10-CM

## 2022-06-18 DIAGNOSIS — N390 Urinary tract infection, site not specified: Principal | ICD-10-CM

## 2022-06-18 MED ORDER — NITROFURANTOIN MONOHYDRATE/MACROCRYSTALS 100 MG CAPSULE
ORAL_CAPSULE | Freq: Two times a day (BID) | ORAL | 0 refills | 7 days | Status: CP
Start: 2022-06-18 — End: 2022-06-25

## 2022-07-04 ENCOUNTER — Ambulatory Visit: Admit: 2022-07-04 | Discharge: 2022-07-05

## 2022-07-04 DIAGNOSIS — E119 Type 2 diabetes mellitus without complications: Principal | ICD-10-CM

## 2022-07-25 MED ORDER — FENOFIBRATE 160 MG TABLET
ORAL_TABLET | Freq: Every day | ORAL | 1 refills | 90 days
Start: 2022-07-25 — End: 2023-07-26

## 2022-07-25 MED ORDER — OMEPRAZOLE 40 MG CAPSULE,DELAYED RELEASE
ORAL_CAPSULE | Freq: Two times a day (BID) | ORAL | 1 refills | 90 days
Start: 2022-07-25 — End: 2023-07-26

## 2022-07-25 MED ORDER — TRULICITY 0.75 MG/0.5 ML SUBCUTANEOUS PEN INJECTOR
2 refills | 0 days
Start: 2022-07-25 — End: ?

## 2022-07-25 MED ORDER — ATORVASTATIN 10 MG TABLET
ORAL_TABLET | Freq: Every evening | ORAL | 1 refills | 90 days
Start: 2022-07-25 — End: 2023-07-26

## 2022-07-25 MED ORDER — CITALOPRAM 10 MG TABLET
ORAL_TABLET | Freq: Every day | ORAL | 1 refills | 90 days
Start: 2022-07-25 — End: 2023-07-25

## 2022-07-26 ENCOUNTER — Ambulatory Visit: Admit: 2022-07-26 | Discharge: 2022-07-27 | Payer: PRIVATE HEALTH INSURANCE

## 2022-07-26 DIAGNOSIS — E119 Type 2 diabetes mellitus without complications: Principal | ICD-10-CM

## 2022-07-26 DIAGNOSIS — N816 Rectocele: Principal | ICD-10-CM

## 2022-07-26 DIAGNOSIS — R102 Pelvic and perineal pain: Principal | ICD-10-CM

## 2022-08-15 ENCOUNTER — Ambulatory Visit: Admit: 2022-08-15 | Discharge: 2022-08-16 | Payer: PRIVATE HEALTH INSURANCE

## 2022-08-15 DIAGNOSIS — N816 Rectocele: Principal | ICD-10-CM

## 2022-08-15 DIAGNOSIS — K921 Melena: Principal | ICD-10-CM

## 2022-08-15 DIAGNOSIS — K649 Unspecified hemorrhoids: Principal | ICD-10-CM

## 2022-08-15 MED ORDER — OMEPRAZOLE 40 MG CAPSULE,DELAYED RELEASE
ORAL_CAPSULE | ORAL | 1 refills | 0.00000 days | Status: CP
Start: 2022-08-15 — End: ?

## 2022-08-15 MED ORDER — PEG 3350-ELECTROLYTES 236 GRAM-22.74 GRAM-6.74 GRAM-5.86 GRAM SOLUTION
Freq: Once | ORAL | 0 refills | 1 days | Status: CP
Start: 2022-08-15 — End: 2022-08-15

## 2022-08-15 MED ORDER — HYDROCORTISONE-PRAMOXINE 2.5 %-1 % RECTAL CREAM
RECTAL | 3 refills | 0.00000 days | Status: CP
Start: 2022-08-15 — End: ?

## 2022-08-16 DIAGNOSIS — E119 Type 2 diabetes mellitus without complications: Principal | ICD-10-CM

## 2022-08-16 MED ORDER — TRULICITY 0.75 MG/0.5 ML SUBCUTANEOUS PEN INJECTOR
2 refills | 0 days | Status: CP
Start: 2022-08-16 — End: ?

## 2022-08-22 ENCOUNTER — Ambulatory Visit: Admit: 2022-08-22 | Discharge: 2022-08-23 | Payer: PRIVATE HEALTH INSURANCE

## 2022-08-30 MED ORDER — ATORVASTATIN 10 MG TABLET
ORAL_TABLET | 1 refills | 0 days | Status: CP
Start: 2022-08-30 — End: ?

## 2022-08-30 MED ORDER — FENOFIBRATE 160 MG TABLET
ORAL_TABLET | 1 refills | 0 days | Status: CP
Start: 2022-08-30 — End: ?

## 2022-09-20 ENCOUNTER — Ambulatory Visit: Admit: 2022-09-20 | Discharge: 2022-09-20 | Payer: PRIVATE HEALTH INSURANCE

## 2022-09-26 MED ORDER — SODIUM,POTASSIUM,MAG SULFATES 17.5 GRAM-3.13 GRAM-1.6 GRAM ORAL SOLN
0 refills | 0 days | Status: CP
Start: 2022-09-26 — End: 2023-09-26

## 2022-10-03 ENCOUNTER — Ambulatory Visit: Admit: 2022-10-03 | Discharge: 2022-10-03 | Payer: PRIVATE HEALTH INSURANCE

## 2022-10-03 ENCOUNTER — Encounter
Admit: 2022-10-03 | Discharge: 2022-10-03 | Payer: PRIVATE HEALTH INSURANCE | Attending: Anesthesiology | Primary: Anesthesiology

## 2022-10-10 ENCOUNTER — Ambulatory Visit
Admit: 2022-10-10 | Discharge: 2022-10-11 | Payer: PRIVATE HEALTH INSURANCE | Attending: Student in an Organized Health Care Education/Training Program | Primary: Student in an Organized Health Care Education/Training Program

## 2022-10-10 DIAGNOSIS — N816 Rectocele: Principal | ICD-10-CM

## 2022-10-10 DIAGNOSIS — M62838 Other muscle spasm: Principal | ICD-10-CM

## 2022-10-10 DIAGNOSIS — R102 Pelvic and perineal pain: Principal | ICD-10-CM

## 2022-10-17 MED ORDER — CITALOPRAM 10 MG TABLET
ORAL_TABLET | Freq: Every day | ORAL | 1 refills | 0.00000 days | Status: CP
Start: 2022-10-17 — End: 2023-10-18

## 2022-10-18 MED ORDER — CITALOPRAM 10 MG TABLET
ORAL_TABLET | Freq: Every day | ORAL | 1 refills | 90 days
Start: 2022-10-18 — End: 2023-10-18

## 2022-10-23 ENCOUNTER — Ambulatory Visit: Admit: 2022-10-23 | Discharge: 2022-10-24 | Payer: PRIVATE HEALTH INSURANCE

## 2022-10-30 MED ORDER — ENBREL SURECLICK 50 MG/ML (1 ML) SUBCUTANEOUS PEN INJECTOR
0 refills | 0 days
Start: 2022-10-30 — End: ?

## 2022-11-01 DIAGNOSIS — L4050 Arthropathic psoriasis, unspecified: Principal | ICD-10-CM

## 2022-11-01 MED ORDER — ENBREL SURECLICK 50 MG/ML (1 ML) SUBCUTANEOUS PEN INJECTOR
SUBCUTANEOUS | 3 refills | 84 days | Status: CP
Start: 2022-11-01 — End: ?

## 2022-11-05 DIAGNOSIS — E119 Type 2 diabetes mellitus without complications: Principal | ICD-10-CM

## 2022-11-05 MED ORDER — TRULICITY 0.75 MG/0.5 ML SUBCUTANEOUS PEN INJECTOR
2 refills | 0 days
Start: 2022-11-05 — End: ?

## 2022-11-06 ENCOUNTER — Ambulatory Visit
Admit: 2022-11-06 | Discharge: 2022-11-07 | Payer: PRIVATE HEALTH INSURANCE | Attending: Rehabilitative and Restorative Service Providers" | Primary: Rehabilitative and Restorative Service Providers"

## 2022-11-06 MED ORDER — TRULICITY 0.75 MG/0.5 ML SUBCUTANEOUS PEN INJECTOR
2 refills | 0 days | Status: CP
Start: 2022-11-06 — End: ?

## 2022-11-20 ENCOUNTER — Ambulatory Visit
Admit: 2022-11-20 | Discharge: 2022-11-21 | Payer: PRIVATE HEALTH INSURANCE | Attending: Rehabilitative and Restorative Service Providers" | Primary: Rehabilitative and Restorative Service Providers"

## 2022-12-22 ENCOUNTER — Ambulatory Visit
Admit: 2022-12-22 | Discharge: 2022-12-23 | Payer: PRIVATE HEALTH INSURANCE | Attending: Rehabilitative and Restorative Service Providers" | Primary: Rehabilitative and Restorative Service Providers"

## 2022-12-22 DIAGNOSIS — K594 Anal spasm: Principal | ICD-10-CM

## 2022-12-22 DIAGNOSIS — M6289 Other specified disorders of muscle: Principal | ICD-10-CM

## 2022-12-22 DIAGNOSIS — M62838 Other muscle spasm: Principal | ICD-10-CM

## 2023-01-05 ENCOUNTER — Telehealth: Payer: Self-pay | Admitting: Family Medicine

## 2023-01-05 NOTE — Telephone Encounter (Signed)
Called and chatted.  Long term patient with multiple family members in the practice.  We both appreciated the call.

## 2023-01-05 NOTE — Telephone Encounter (Signed)
Patient of Dr. Andria Frames. Had not been seen by the practice in over three years. I called to speak with her, and she said she had to switch PCP to a different practice for insurance reasons.  I did mention to her that Dr. Andria Frames is retiring, and she requested to be invited to the retirement party at [email or web address]. As discussed with her, I will get this information across to the appropriate individuals.  She is now off our panelist.

## 2023-01-06 MED ORDER — MELOXICAM 15 MG TABLET
0 refills | 0 days
Start: 2023-01-06 — End: ?

## 2023-01-08 MED ORDER — MELOXICAM 15 MG TABLET
ORAL_TABLET | Freq: Every day | ORAL | 0 refills | 30 days
Start: 2023-01-08 — End: 2024-01-08

## 2023-01-09 MED ORDER — MELOXICAM 15 MG TABLET
0 refills | 0 days
Start: 2023-01-09 — End: ?

## 2023-01-10 ENCOUNTER — Ambulatory Visit
Admit: 2023-01-10 | Discharge: 2023-01-11 | Payer: PRIVATE HEALTH INSURANCE | Attending: Rehabilitative and Restorative Service Providers" | Primary: Rehabilitative and Restorative Service Providers"

## 2023-01-10 MED ORDER — MELOXICAM 15 MG TABLET
ORAL_TABLET | Freq: Every day | ORAL | 3 refills | 90 days
Start: 2023-01-10 — End: ?

## 2023-01-11 MED ORDER — MELOXICAM 15 MG TABLET
ORAL_TABLET | Freq: Every day | ORAL | 3 refills | 90 days | Status: CP
Start: 2023-01-11 — End: ?

## 2023-01-17 ENCOUNTER — Ambulatory Visit
Admit: 2023-01-17 | Discharge: 2023-01-17 | Disposition: A | Discharge status: Home / Self Care | Payer: PRIVATE HEALTH INSURANCE

## 2023-01-17 ENCOUNTER — Emergency Department
Admit: 2023-01-17 | Discharge: 2023-01-17 | Disposition: A | Discharge status: Home / Self Care | Payer: PRIVATE HEALTH INSURANCE

## 2023-01-17 DIAGNOSIS — R1013 Epigastric pain: Principal | ICD-10-CM

## 2023-01-17 MED ORDER — DICLOFENAC 1 % TOPICAL GEL
Freq: Four times a day (QID) | TOPICAL | 2 refills | 13 days | Status: CP
Start: 2023-01-17 — End: 2023-02-24

## 2023-01-17 MED ORDER — FAMOTIDINE 20 MG TABLET
ORAL_TABLET | Freq: Two times a day (BID) | ORAL | 0 refills | 15 days | Status: CP
Start: 2023-01-17 — End: 2023-02-01

## 2023-01-24 ENCOUNTER — Ambulatory Visit
Admit: 2023-01-24 | Discharge: 2023-01-25 | Payer: PRIVATE HEALTH INSURANCE | Attending: Hematology & Oncology | Primary: Hematology & Oncology

## 2023-01-24 DIAGNOSIS — K5900 Constipation, unspecified: Principal | ICD-10-CM

## 2023-01-24 DIAGNOSIS — K769 Liver disease, unspecified: Principal | ICD-10-CM

## 2023-01-24 DIAGNOSIS — K219 Gastro-esophageal reflux disease without esophagitis: Principal | ICD-10-CM

## 2023-01-30 DIAGNOSIS — E119 Type 2 diabetes mellitus without complications: Principal | ICD-10-CM

## 2023-01-30 MED ORDER — TRULICITY 0.75 MG/0.5 ML SUBCUTANEOUS PEN INJECTOR
2 refills | 0 days | Status: CP
Start: 2023-01-30 — End: ?

## 2023-02-02 ENCOUNTER — Ambulatory Visit: Admit: 2023-02-02 | Discharge: 2023-02-03 | Payer: PRIVATE HEALTH INSURANCE

## 2023-02-12 ENCOUNTER — Ambulatory Visit: Admit: 2023-02-12 | Discharge: 2023-02-13 | Payer: PRIVATE HEALTH INSURANCE

## 2023-02-12 DIAGNOSIS — E118 Type 2 diabetes mellitus with unspecified complications: Principal | ICD-10-CM

## 2023-02-12 DIAGNOSIS — E785 Hyperlipidemia, unspecified: Principal | ICD-10-CM

## 2023-02-12 DIAGNOSIS — D351 Benign neoplasm of parathyroid gland: Principal | ICD-10-CM

## 2023-02-12 DIAGNOSIS — Z0000 Encounter for general adult medical examination without abnormal findings: Principal | ICD-10-CM

## 2023-02-12 DIAGNOSIS — E213 Hyperparathyroidism, unspecified: Principal | ICD-10-CM

## 2023-02-12 MED ORDER — FENOFIBRATE 160 MG TABLET
ORAL_TABLET | Freq: Every day | ORAL | 3 refills | 90 days | Status: CP
Start: 2023-02-12 — End: ?

## 2023-02-12 MED ORDER — OMEPRAZOLE 40 MG CAPSULE,DELAYED RELEASE
ORAL_CAPSULE | Freq: Two times a day (BID) | ORAL | 3 refills | 90.00000 days | Status: CP
Start: 2023-02-12 — End: ?

## 2023-02-12 MED ORDER — ATORVASTATIN 10 MG TABLET
ORAL_TABLET | Freq: Every day | ORAL | 3 refills | 90 days | Status: CP
Start: 2023-02-12 — End: ?

## 2023-02-12 MED ORDER — CITALOPRAM 10 MG TABLET
ORAL_TABLET | 3 refills | 0 days | Status: CP
Start: 2023-02-12 — End: 2024-02-13

## 2023-02-16 ENCOUNTER — Ambulatory Visit
Admit: 2023-02-16 | Discharge: 2023-02-17 | Payer: PRIVATE HEALTH INSURANCE | Attending: Student in an Organized Health Care Education/Training Program | Primary: Student in an Organized Health Care Education/Training Program

## 2023-02-16 DIAGNOSIS — E118 Type 2 diabetes mellitus with unspecified complications: Principal | ICD-10-CM

## 2023-02-16 DIAGNOSIS — D351 Benign neoplasm of parathyroid gland: Principal | ICD-10-CM

## 2023-02-16 DIAGNOSIS — E210 Primary hyperparathyroidism: Principal | ICD-10-CM

## 2023-02-17 ENCOUNTER — Ambulatory Visit: Admit: 2023-02-17 | Discharge: 2023-02-18 | Payer: PRIVATE HEALTH INSURANCE

## 2023-02-19 DIAGNOSIS — K862 Cyst of pancreas: Principal | ICD-10-CM

## 2023-02-27 DIAGNOSIS — K862 Cyst of pancreas: Principal | ICD-10-CM

## 2023-03-01 MED ORDER — OMEPRAZOLE 40 MG CAPSULE,DELAYED RELEASE
ORAL_CAPSULE | Freq: Two times a day (BID) | ORAL | 3 refills | 90 days | Status: CP
Start: 2023-03-01 — End: 2024-02-29

## 2023-03-20 ENCOUNTER — Ambulatory Visit: Admit: 2023-03-20 | Discharge: 2023-03-21 | Payer: PRIVATE HEALTH INSURANCE | Attending: Family | Primary: Family

## 2023-03-20 DIAGNOSIS — K862 Cyst of pancreas: Principal | ICD-10-CM

## 2023-04-02 MED ORDER — PREDNISONE 50 MG TABLET
ORAL_TABLET | ORAL | 0 refills | 0 days
Start: 2023-04-02 — End: ?

## 2023-04-02 MED ORDER — DIPHENHYDRAMINE 50 MG CAPSULE
ORAL_CAPSULE | Freq: Once | ORAL | 0 refills | 1 days
Start: 2023-04-02 — End: 2023-04-02

## 2023-04-08 MED ORDER — CITALOPRAM 10 MG TABLET
ORAL_TABLET | 3 refills | 0 days
Start: 2023-04-08 — End: ?

## 2023-04-09 MED ORDER — CITALOPRAM 10 MG TABLET
ORAL_TABLET | 3 refills | 0 days | Status: CP
Start: 2023-04-09 — End: ?

## 2023-04-11 ENCOUNTER — Telehealth: Admit: 2023-04-11 | Discharge: 2023-04-12 | Payer: PRIVATE HEALTH INSURANCE

## 2023-04-11 DIAGNOSIS — B029 Zoster without complications: Principal | ICD-10-CM

## 2023-04-11 DIAGNOSIS — L309 Dermatitis, unspecified: Principal | ICD-10-CM

## 2023-04-11 MED ORDER — VALACYCLOVIR 1 GRAM TABLET
ORAL_TABLET | Freq: Three times a day (TID) | ORAL | 0 refills | 7 days | Status: CP
Start: 2023-04-11 — End: 2023-04-18

## 2023-04-18 MED ORDER — DIPHENHYDRAMINE 50 MG CAPSULE
ORAL_CAPSULE | Freq: Once | ORAL | 0 refills | 1 days | Status: CP
Start: 2023-04-18 — End: 2023-04-18

## 2023-04-18 MED ORDER — PREDNISONE 50 MG TABLET
ORAL_TABLET | ORAL | 0 refills | 0 days | Status: CP
Start: 2023-04-18 — End: ?

## 2023-04-20 DIAGNOSIS — E119 Type 2 diabetes mellitus without complications: Principal | ICD-10-CM

## 2023-04-20 MED ORDER — TRULICITY 0.75 MG/0.5 ML SUBCUTANEOUS PEN INJECTOR
2 refills | 0 days | Status: CP
Start: 2023-04-20 — End: ?

## 2023-05-04 ENCOUNTER — Ambulatory Visit
Admit: 2023-05-04 | Discharge: 2023-05-05 | Payer: PRIVATE HEALTH INSURANCE | Attending: Hematology & Oncology | Primary: Hematology & Oncology

## 2023-06-04 ENCOUNTER — Ambulatory Visit: Admit: 2023-06-04 | Discharge: 2023-06-05 | Payer: PRIVATE HEALTH INSURANCE

## 2023-06-04 DIAGNOSIS — L4050 Arthropathic psoriasis, unspecified: Principal | ICD-10-CM

## 2023-07-19 DIAGNOSIS — E119 Type 2 diabetes mellitus without complications: Principal | ICD-10-CM

## 2023-07-19 MED ORDER — TRULICITY 0.75 MG/0.5 ML SUBCUTANEOUS PEN INJECTOR
2 refills | 0 days
Start: 2023-07-19 — End: ?

## 2023-07-20 MED ORDER — TRULICITY 0.75 MG/0.5 ML SUBCUTANEOUS PEN INJECTOR
2 refills | 0 days | Status: CP
Start: 2023-07-20 — End: ?

## 2023-08-16 ENCOUNTER — Ambulatory Visit
Admit: 2023-08-16 | Discharge: 2023-08-17 | Payer: PRIVATE HEALTH INSURANCE | Attending: Student in an Organized Health Care Education/Training Program | Primary: Student in an Organized Health Care Education/Training Program

## 2023-08-16 DIAGNOSIS — E119 Type 2 diabetes mellitus without complications: Principal | ICD-10-CM

## 2023-08-16 DIAGNOSIS — K219 Gastro-esophageal reflux disease without esophagitis: Principal | ICD-10-CM

## 2023-08-16 DIAGNOSIS — E213 Hyperparathyroidism, unspecified: Principal | ICD-10-CM

## 2023-08-16 DIAGNOSIS — E785 Hyperlipidemia, unspecified: Principal | ICD-10-CM

## 2023-08-23 DIAGNOSIS — Z1231 Encounter for screening mammogram for malignant neoplasm of breast: Principal | ICD-10-CM

## 2023-09-03 ENCOUNTER — Ambulatory Visit
Admit: 2023-09-03 | Discharge: 2023-09-04 | Payer: PRIVATE HEALTH INSURANCE | Attending: Student in an Organized Health Care Education/Training Program | Primary: Student in an Organized Health Care Education/Training Program

## 2023-09-03 DIAGNOSIS — L989 Disorder of the skin and subcutaneous tissue, unspecified: Principal | ICD-10-CM

## 2023-09-05 DIAGNOSIS — L089 Local infection of the skin and subcutaneous tissue, unspecified: Principal | ICD-10-CM

## 2023-09-05 MED ORDER — DOXYCYCLINE HYCLATE 100 MG CAPSULE
ORAL_CAPSULE | Freq: Two times a day (BID) | ORAL | 0 refills | 10 days | Status: CP
Start: 2023-09-05 — End: 2023-09-15

## 2023-09-07 ENCOUNTER — Ambulatory Visit
Admit: 2023-09-07 | Discharge: 2023-09-08 | Payer: PRIVATE HEALTH INSURANCE | Attending: Student in an Organized Health Care Education/Training Program | Primary: Student in an Organized Health Care Education/Training Program

## 2023-09-07 DIAGNOSIS — D3610 Benign neoplasm of peripheral nerves and autonomic nervous system, unspecified: Principal | ICD-10-CM

## 2023-09-07 DIAGNOSIS — Z5189 Encounter for other specified aftercare: Principal | ICD-10-CM

## 2023-09-10 ENCOUNTER — Ambulatory Visit: Admit: 2023-09-10 | Discharge: 2023-09-11 | Payer: PRIVATE HEALTH INSURANCE

## 2023-09-12 DIAGNOSIS — U071 COVID-19: Principal | ICD-10-CM

## 2023-09-12 MED ORDER — MOLNUPIRAVIR 200 MG CAPSULE (EUA)
ORAL_CAPSULE | Freq: Two times a day (BID) | ORAL | 0 refills | 5 days | Status: CP
Start: 2023-09-12 — End: 2023-09-17

## 2023-09-13 DIAGNOSIS — K862 Cyst of pancreas: Principal | ICD-10-CM

## 2023-09-13 DIAGNOSIS — Z91041 Radiographic dye allergy status: Principal | ICD-10-CM

## 2023-09-13 MED ORDER — PREDNISONE 50 MG TABLET
ORAL_TABLET | 0 refills | 0 days | Status: CP
Start: 2023-09-13 — End: ?

## 2023-09-13 MED ORDER — DIPHENHYDRAMINE 50 MG TABLET
ORAL_TABLET | 0 refills | 30 days | Status: CP
Start: 2023-09-13 — End: 2023-10-13

## 2023-09-21 DIAGNOSIS — L4050 Arthropathic psoriasis, unspecified: Principal | ICD-10-CM

## 2023-09-21 MED ORDER — ENBREL SURECLICK 50 MG/ML (1 ML) SUBCUTANEOUS PEN INJECTOR
SUBCUTANEOUS | 3 refills | 84 days | Status: CP
Start: 2023-09-21 — End: ?

## 2023-09-25 ENCOUNTER — Ambulatory Visit: Admit: 2023-09-25 | Discharge: 2023-09-26 | Payer: PRIVATE HEALTH INSURANCE

## 2023-09-25 DIAGNOSIS — Z1231 Encounter for screening mammogram for malignant neoplasm of breast: Principal | ICD-10-CM

## 2023-10-06 DIAGNOSIS — E119 Type 2 diabetes mellitus without complications: Principal | ICD-10-CM

## 2023-10-06 MED ORDER — TRULICITY 0.75 MG/0.5 ML SUBCUTANEOUS PEN INJECTOR
2 refills | 0 days
Start: 2023-10-06 — End: ?

## 2023-10-08 MED ORDER — TRULICITY 0.75 MG/0.5 ML SUBCUTANEOUS PEN INJECTOR
2 refills | 0 days | Status: CP
Start: 2023-10-08 — End: ?

## 2023-10-18 ENCOUNTER — Ambulatory Visit: Admit: 2023-10-18 | Discharge: 2023-10-19 | Payer: BLUE CROSS/BLUE SHIELD

## 2023-10-18 ENCOUNTER — Ambulatory Visit
Admit: 2023-10-18 | Discharge: 2023-10-19 | Payer: BLUE CROSS/BLUE SHIELD | Attending: Student in an Organized Health Care Education/Training Program | Primary: Student in an Organized Health Care Education/Training Program

## 2023-10-18 DIAGNOSIS — M79672 Pain in left foot: Principal | ICD-10-CM

## 2023-10-19 DIAGNOSIS — M79672 Pain in left foot: Principal | ICD-10-CM

## 2023-11-07 ENCOUNTER — Ambulatory Visit: Admit: 2023-11-07 | Discharge: 2023-11-08 | Payer: BLUE CROSS/BLUE SHIELD

## 2023-11-07 DIAGNOSIS — D1801 Hemangioma of skin and subcutaneous tissue: Principal | ICD-10-CM

## 2023-11-07 DIAGNOSIS — D229 Melanocytic nevi, unspecified: Principal | ICD-10-CM

## 2023-11-07 DIAGNOSIS — L814 Other melanin hyperpigmentation: Principal | ICD-10-CM

## 2023-11-07 DIAGNOSIS — L820 Inflamed seborrheic keratosis: Principal | ICD-10-CM

## 2023-11-27 ENCOUNTER — Ambulatory Visit: Admit: 2023-11-27 | Discharge: 2023-11-28 | Payer: BLUE CROSS/BLUE SHIELD

## 2023-12-08 DIAGNOSIS — E119 Type 2 diabetes mellitus without complications: Principal | ICD-10-CM

## 2023-12-08 MED ORDER — TRULICITY 0.75 MG/0.5 ML SUBCUTANEOUS PEN INJECTOR
2 refills | 0.00 days
Start: 2023-12-08 — End: ?

## 2023-12-09 MED ORDER — TRULICITY 0.75 MG/0.5 ML SUBCUTANEOUS PEN INJECTOR
2 refills | 0.00 days
Start: 2023-12-09 — End: 2024-12-08

## 2023-12-10 DIAGNOSIS — E119 Type 2 diabetes mellitus without complications: Principal | ICD-10-CM

## 2023-12-10 MED ORDER — TRULICITY 0.75 MG/0.5 ML SUBCUTANEOUS PEN INJECTOR
2 refills | 0.00 days | Status: CP
Start: 2023-12-10 — End: 2023-12-10

## 2024-01-01 MED ORDER — ATORVASTATIN 10 MG TABLET
ORAL_TABLET | Freq: Every day | ORAL | 3 refills | 90 days | Status: CP
Start: 2024-01-01 — End: ?

## 2024-01-13 DIAGNOSIS — E119 Type 2 diabetes mellitus without complications: Principal | ICD-10-CM

## 2024-01-13 MED ORDER — TRULICITY 0.75 MG/0.5 ML SUBCUTANEOUS PEN INJECTOR
2 refills | 0.00 days
Start: 2024-01-13 — End: ?

## 2024-01-14 MED ORDER — TRULICITY 0.75 MG/0.5 ML SUBCUTANEOUS PEN INJECTOR
2 refills | 0.00 days | Status: CP
Start: 2024-01-14 — End: ?

## 2024-02-15 ENCOUNTER — Ambulatory Visit
Admit: 2024-02-15 | Discharge: 2024-02-16 | Payer: BLUE CROSS/BLUE SHIELD | Attending: Student in an Organized Health Care Education/Training Program | Primary: Student in an Organized Health Care Education/Training Program

## 2024-02-15 DIAGNOSIS — K862 Cyst of pancreas: Principal | ICD-10-CM

## 2024-02-15 DIAGNOSIS — Z130 Encounter for screening for diseases of the blood and blood-forming organs and certain disorders involving the immune mechanism: Principal | ICD-10-CM

## 2024-02-15 DIAGNOSIS — Z0000 Encounter for general adult medical examination without abnormal findings: Principal | ICD-10-CM

## 2024-02-15 DIAGNOSIS — K760 Fatty (change of) liver, not elsewhere classified: Principal | ICD-10-CM

## 2024-02-15 DIAGNOSIS — E118 Type 2 diabetes mellitus with unspecified complications: Principal | ICD-10-CM

## 2024-02-15 DIAGNOSIS — F4323 Adjustment disorder with mixed anxiety and depressed mood: Principal | ICD-10-CM

## 2024-02-15 DIAGNOSIS — D351 Benign neoplasm of parathyroid gland: Principal | ICD-10-CM

## 2024-02-15 DIAGNOSIS — E785 Hyperlipidemia, unspecified: Principal | ICD-10-CM

## 2024-02-15 DIAGNOSIS — E213 Hyperparathyroidism, unspecified: Principal | ICD-10-CM

## 2024-02-15 MED ORDER — DULAGLUTIDE 1.5 MG/0.5 ML SUBCUTANEOUS PEN INJECTOR
SUBCUTANEOUS | 2 refills | 28.00 days | Status: CP
Start: 2024-02-15 — End: 2024-05-15

## 2024-02-27 MED ORDER — OMEPRAZOLE 40 MG CAPSULE,DELAYED RELEASE
ORAL_CAPSULE | 3 refills | 0.00 days | Status: CP
Start: 2024-02-27 — End: ?

## 2024-03-04 ENCOUNTER — Inpatient Hospital Stay: Admit: 2024-03-04 | Discharge: 2024-03-05 | Payer: BLUE CROSS/BLUE SHIELD

## 2024-03-06 ENCOUNTER — Ambulatory Visit
Admit: 2024-03-06 | Discharge: 2024-03-07 | Payer: BLUE CROSS/BLUE SHIELD | Attending: Student in an Organized Health Care Education/Training Program | Primary: Student in an Organized Health Care Education/Training Program

## 2024-03-06 DIAGNOSIS — K449 Diaphragmatic hernia without obstruction or gangrene: Principal | ICD-10-CM

## 2024-03-06 DIAGNOSIS — R1310 Dysphagia, unspecified: Principal | ICD-10-CM

## 2024-03-06 DIAGNOSIS — K219 Gastro-esophageal reflux disease without esophagitis: Principal | ICD-10-CM

## 2024-03-13 DIAGNOSIS — R1310 Dysphagia, unspecified: Principal | ICD-10-CM

## 2024-03-18 ENCOUNTER — Encounter
Admit: 2024-03-18 | Discharge: 2024-03-18 | Attending: Student in an Organized Health Care Education/Training Program | Primary: Student in an Organized Health Care Education/Training Program

## 2024-03-18 ENCOUNTER — Inpatient Hospital Stay: Admit: 2024-03-18 | Discharge: 2024-03-18 | Payer: BLUE CROSS/BLUE SHIELD

## 2024-03-21 DIAGNOSIS — K449 Diaphragmatic hernia without obstruction or gangrene: Principal | ICD-10-CM

## 2024-03-21 DIAGNOSIS — K219 Gastro-esophageal reflux disease without esophagitis: Principal | ICD-10-CM

## 2024-04-04 DIAGNOSIS — R079 Chest pain, unspecified: Principal | ICD-10-CM

## 2024-04-04 MED ORDER — HYOSCYAMINE 0.125 MG SUBLINGUAL TABLET
ORAL_TABLET | Freq: Three times a day (TID) | SUBLINGUAL | 0 refills | 7.00 days | Status: CP | PRN
Start: 2024-04-04 — End: 2024-05-04

## 2024-04-05 ENCOUNTER — Emergency Department
Admit: 2024-04-05 | Discharge: 2024-04-05 | Disposition: A | Discharge status: Home / Self Care | Payer: BLUE CROSS/BLUE SHIELD

## 2024-04-11 DIAGNOSIS — K219 Gastro-esophageal reflux disease without esophagitis: Principal | ICD-10-CM

## 2024-04-11 MED ORDER — HYOSCYAMINE 0.125 MG SUBLINGUAL TABLET
ORAL_TABLET | Freq: Three times a day (TID) | SUBLINGUAL | 0 refills | 30.00000 days | Status: CP | PRN
Start: 2024-04-11 — End: 2024-05-11

## 2024-04-13 MED ORDER — FENOFIBRATE 160 MG TABLET
ORAL_TABLET | Freq: Every day | ORAL | 3 refills | 90.00000 days
Start: 2024-04-13 — End: ?

## 2024-04-14 MED ORDER — FENOFIBRATE 160 MG TABLET
ORAL_TABLET | Freq: Every day | ORAL | 3 refills | 90.00000 days | Status: CP
Start: 2024-04-14 — End: ?

## 2024-04-28 MED ORDER — CITALOPRAM 10 MG TABLET
ORAL_TABLET | ORAL | 3 refills | 0.00000 days | Status: CP
Start: 2024-04-28 — End: ?

## 2024-04-30 DIAGNOSIS — R1310 Dysphagia, unspecified: Principal | ICD-10-CM

## 2024-04-30 DIAGNOSIS — K449 Diaphragmatic hernia without obstruction or gangrene: Principal | ICD-10-CM

## 2024-04-30 DIAGNOSIS — K219 Gastro-esophageal reflux disease without esophagitis: Principal | ICD-10-CM

## 2024-05-01 ENCOUNTER — Inpatient Hospital Stay: Admit: 2024-05-01 | Discharge: 2024-05-02 | Payer: BLUE CROSS/BLUE SHIELD

## 2024-05-01 ENCOUNTER — Ambulatory Visit: Admit: 2024-05-01 | Discharge: 2024-05-02 | Payer: BLUE CROSS/BLUE SHIELD

## 2024-05-05 ENCOUNTER — Ambulatory Visit: Admit: 2024-05-05 | Discharge: 2024-05-06 | Payer: BLUE CROSS/BLUE SHIELD | Attending: Family | Primary: Family

## 2024-05-09 ENCOUNTER — Inpatient Hospital Stay: Admit: 2024-05-09 | Discharge: 2024-05-09 | Payer: BLUE CROSS/BLUE SHIELD

## 2024-05-18 DIAGNOSIS — K219 Gastro-esophageal reflux disease without esophagitis: Principal | ICD-10-CM

## 2024-05-18 MED ORDER — HYOSCYAMINE 0.125 MG SUBLINGUAL TABLET
ORAL_TABLET | SUBLINGUAL | 0 refills | 0.00000 days | Status: CP
Start: 2024-05-18 — End: ?

## 2024-05-19 ENCOUNTER — Ambulatory Visit
Admit: 2024-05-19 | Discharge: 2024-05-20 | Payer: BLUE CROSS/BLUE SHIELD | Attending: Student in an Organized Health Care Education/Training Program | Primary: Student in an Organized Health Care Education/Training Program

## 2024-05-19 DIAGNOSIS — L4050 Arthropathic psoriasis, unspecified: Principal | ICD-10-CM

## 2024-05-19 MED ORDER — ENBREL SURECLICK 50 MG/ML (1 ML) SUBCUTANEOUS PEN INJECTOR
SUBCUTANEOUS | 3 refills | 84.00000 days | Status: CP
Start: 2024-05-19 — End: ?

## 2024-05-21 DIAGNOSIS — K224 Dyskinesia of esophagus: Principal | ICD-10-CM

## 2024-05-21 DIAGNOSIS — R1310 Dysphagia, unspecified: Principal | ICD-10-CM

## 2024-05-24 DIAGNOSIS — E118 Type 2 diabetes mellitus with unspecified complications: Principal | ICD-10-CM

## 2024-05-24 MED ORDER — TRULICITY 1.5 MG/0.5 ML SUBCUTANEOUS PEN INJECTOR
2 refills | 0.00000 days
Start: 2024-05-24 — End: ?

## 2024-05-26 MED ORDER — TRULICITY 1.5 MG/0.5 ML SUBCUTANEOUS PEN INJECTOR
SUBCUTANEOUS | 2 refills | 0.00000 days | Status: CP
Start: 2024-05-26 — End: ?

## 2024-07-17 MED ORDER — CITALOPRAM 10 MG TABLET
ORAL_TABLET | ORAL | 3 refills | 0.00000 days | Status: CP
Start: 2024-07-17 — End: ?

## 2024-07-17 MED ORDER — FENOFIBRATE 160 MG TABLET
ORAL_TABLET | Freq: Every day | ORAL | 3 refills | 90.00000 days | Status: CP
Start: 2024-07-17 — End: ?

## 2024-07-18 DIAGNOSIS — L4050 Arthropathic psoriasis, unspecified: Principal | ICD-10-CM

## 2024-07-18 MED ORDER — ENBREL SURECLICK 50 MG/ML (1 ML) SUBCUTANEOUS PEN INJECTOR
SUBCUTANEOUS | 3 refills | 84.00000 days | Status: CP
Start: 2024-07-18 — End: ?

## 2024-07-19 DIAGNOSIS — E118 Type 2 diabetes mellitus with unspecified complications: Principal | ICD-10-CM

## 2024-07-21 MED ORDER — TRULICITY 1.5 MG/0.5 ML SUBCUTANEOUS PEN INJECTOR
SUBCUTANEOUS | 1 refills | 84.00000 days | Status: CP
Start: 2024-07-21 — End: 2025-01-17

## 2024-07-22 DIAGNOSIS — L4050 Arthropathic psoriasis, unspecified: Principal | ICD-10-CM

## 2024-07-22 MED ORDER — ENBREL SURECLICK 50 MG/ML (1 ML) SUBCUTANEOUS PEN INJECTOR
SUBCUTANEOUS | 3 refills | 84.00000 days | Status: CP
Start: 2024-07-22 — End: ?

## 2024-08-05 DIAGNOSIS — L259 Unspecified contact dermatitis, unspecified cause: Principal | ICD-10-CM

## 2024-08-05 DIAGNOSIS — L409 Psoriasis, unspecified: Principal | ICD-10-CM

## 2024-08-15 ENCOUNTER — Encounter
Admit: 2024-08-15 | Discharge: 2024-08-15 | Payer: Medicare (Managed Care) | Attending: Student in an Organized Health Care Education/Training Program | Primary: Student in an Organized Health Care Education/Training Program

## 2024-08-15 DIAGNOSIS — Z1231 Encounter for screening mammogram for malignant neoplasm of breast: Principal | ICD-10-CM

## 2024-08-15 DIAGNOSIS — Z1239 Encounter for other screening for malignant neoplasm of breast: Principal | ICD-10-CM

## 2024-08-15 DIAGNOSIS — L409 Psoriasis, unspecified: Principal | ICD-10-CM

## 2024-08-15 DIAGNOSIS — E213 Hyperparathyroidism, unspecified: Principal | ICD-10-CM

## 2024-08-15 DIAGNOSIS — E785 Hyperlipidemia, unspecified: Principal | ICD-10-CM

## 2024-08-15 DIAGNOSIS — K760 Fatty (change of) liver, not elsewhere classified: Principal | ICD-10-CM

## 2024-08-15 DIAGNOSIS — E118 Type 2 diabetes mellitus with unspecified complications: Principal | ICD-10-CM

## 2024-08-15 DIAGNOSIS — K219 Gastro-esophageal reflux disease without esophagitis: Principal | ICD-10-CM

## 2024-08-15 MED ORDER — FENOFIBRATE 160 MG TABLET
ORAL_TABLET | Freq: Every day | ORAL | 3 refills | 90.00000 days | Status: CP
Start: 2024-08-15 — End: ?

## 2024-08-15 MED ORDER — OMEPRAZOLE 40 MG CAPSULE,DELAYED RELEASE
ORAL_CAPSULE | Freq: Every day | ORAL | 1 refills | 90.00000 days | Status: CP
Start: 2024-08-15 — End: 2025-02-11

## 2024-08-15 MED ORDER — OMEGA 3-DHA-EPA-FISH OIL 1,000 MG (120 MG-180 MG) CAPSULE
ORAL_CAPSULE | Freq: Every day | ORAL | 1 refills | 90.00000 days | Status: CP
Start: 2024-08-15 — End: 2025-02-11

## 2024-08-15 MED ORDER — TRULICITY 1.5 MG/0.5 ML SUBCUTANEOUS PEN INJECTOR
SUBCUTANEOUS | 1 refills | 84.00000 days | Status: CP
Start: 2024-08-15 — End: 2025-02-11

## 2024-08-15 MED ORDER — ATORVASTATIN 10 MG TABLET
ORAL_TABLET | Freq: Every day | ORAL | 3 refills | 90.00000 days | Status: CP
Start: 2024-08-15 — End: ?

## 2024-08-20 MED ORDER — OMEPRAZOLE 40 MG CAPSULE,DELAYED RELEASE
ORAL_CAPSULE | Freq: Two times a day (BID) | ORAL | 3 refills | 90.00000 days | Status: CP
Start: 2024-08-20 — End: 2025-08-20

## 2024-08-21 DIAGNOSIS — R1310 Dysphagia, unspecified: Principal | ICD-10-CM

## 2024-08-21 DIAGNOSIS — K219 Gastro-esophageal reflux disease without esophagitis: Principal | ICD-10-CM

## 2024-08-23 DIAGNOSIS — R1310 Dysphagia, unspecified: Principal | ICD-10-CM

## 2024-08-24 DIAGNOSIS — R1310 Dysphagia, unspecified: Principal | ICD-10-CM

## 2024-08-28 MED ORDER — OMEPRAZOLE 40 MG CAPSULE,DELAYED RELEASE
ORAL_CAPSULE | Freq: Two times a day (BID) | ORAL | 3 refills | 90.00000 days | Status: CP
Start: 2024-08-28 — End: 2025-08-28

## 2024-09-01 MED ORDER — DIPHENHYDRAMINE 50 MG TABLET
ORAL_TABLET | ORAL | 0 refills | 0.00000 days | Status: CP
Start: 2024-09-01 — End: ?

## 2024-09-01 MED ORDER — PREDNISONE 50 MG TABLET
ORAL_TABLET | ORAL | 0 refills | 0.00000 days | Status: CP
Start: 2024-09-01 — End: ?

## 2024-09-13 MED ORDER — FREESTYLE LIBRE 3 READER
ORAL | 5 refills | 0.00000 days | Status: CP
Start: 2024-09-13 — End: ?

## 2024-09-13 MED ORDER — FREESTYLE LIBRE 3 PLUS SENSOR DEVICE
3 refills | 0.00000 days | Status: CP
Start: 2024-09-13 — End: ?

## 2024-09-15 ENCOUNTER — Inpatient Hospital Stay: Admit: 2024-09-15 | Discharge: 2024-09-15 | Payer: Medicare (Managed Care)

## 2024-10-03 MED ORDER — LIDOCAINE 2% - MAALOX ORAL SUSP
Freq: Once | ORAL | 0 refills | 1.00000 days | Status: CP
Start: 2024-10-03 — End: 2024-10-03

## 2024-10-14 ENCOUNTER — Inpatient Hospital Stay: Admit: 2024-10-14 | Discharge: 2024-10-14 | Payer: Medicare (Managed Care)

## 2024-10-22 ENCOUNTER — Ambulatory Visit: Admit: 2024-10-22 | Discharge: 2024-10-23 | Payer: Medicare (Managed Care) | Attending: Medical | Primary: Medical

## 2024-10-22 DIAGNOSIS — R1319 Other dysphagia: Principal | ICD-10-CM

## 2024-10-22 MED ORDER — DIPHENHYDRAMINE 50 MG CAPSULE
ORAL_CAPSULE | Freq: Once | ORAL | 0 refills | 1.00000 days | Status: CP
Start: 2024-10-22 — End: 2024-10-22

## 2024-10-22 MED ORDER — PREDNISONE 50 MG TABLET
ORAL_TABLET | ORAL | 0 refills | 0.00000 days | Status: CP
Start: 2024-10-22 — End: ?

## 2025-01-17 DIAGNOSIS — L4050 Arthropathic psoriasis, unspecified: Principal | ICD-10-CM
